# Patient Record
Sex: Male | Born: 1970 | Race: Black or African American | Hispanic: No | Marital: Married | Smoking: Never smoker
Health system: Southern US, Community
[De-identification: ages and names within clinical notes are randomized; demographics above are authoritative.]

## PROBLEM LIST (undated history)

## (undated) DIAGNOSIS — E119 Type 2 diabetes mellitus without complications: Secondary | ICD-10-CM

## (undated) DIAGNOSIS — I10 Essential (primary) hypertension: Secondary | ICD-10-CM

## (undated) DIAGNOSIS — N200 Calculus of kidney: Secondary | ICD-10-CM

## (undated) HISTORY — PX: KIDNEY STONE SURGERY: SHX686

---

## 2016-08-07 ENCOUNTER — Emergency Department (HOSPITAL_COMMUNITY)
Admission: EM | Admit: 2016-08-07 | Discharge: 2016-08-08 | Disposition: A | Discharge status: Home / Self Care | Payer: Self-pay | Attending: Emergency Medicine | Admitting: Emergency Medicine

## 2016-08-07 ENCOUNTER — Encounter (HOSPITAL_COMMUNITY): Payer: Self-pay | Admitting: Emergency Medicine

## 2016-08-07 DIAGNOSIS — Y999 Unspecified external cause status: Secondary | ICD-10-CM | POA: Insufficient documentation

## 2016-08-07 DIAGNOSIS — T17208A Unspecified foreign body in pharynx causing other injury, initial encounter: Secondary | ICD-10-CM | POA: Insufficient documentation

## 2016-08-07 DIAGNOSIS — Y939 Activity, unspecified: Secondary | ICD-10-CM | POA: Insufficient documentation

## 2016-08-07 DIAGNOSIS — Y929 Unspecified place or not applicable: Secondary | ICD-10-CM | POA: Insufficient documentation

## 2016-08-07 DIAGNOSIS — X58XXXA Exposure to other specified factors, initial encounter: Secondary | ICD-10-CM | POA: Insufficient documentation

## 2016-08-07 MED ORDER — BENZOCAINE (TOPICAL) 20 % EX AERO
INHALATION_SPRAY | Freq: Once | CUTANEOUS | Status: AC
  Administered 2016-08-08: via OROMUCOSAL
  Filled 2016-08-07: qty 57

## 2016-08-07 NOTE — ED Notes (Signed)
ED Provider at bedside with ENT cart.  

## 2016-08-07 NOTE — ED Triage Notes (Signed)
Pt. reports fish bone is stuck at right side of throat this evening , denies emesis /respirations unlabored .

## 2016-08-07 NOTE — ED Provider Notes (Signed)
[facility]-EMERGENCY DEPT Provider Note   CSN: [id number] Arrival date & time: 08/07/16  2256  By signing my name below, I, Christopher Kaiser, attest that this documentation has been prepared under the direction and in the presence of Dione Boozeavid Maelynn Moroney, MD. Electronically Signed: Rosario AdieWilliam Andrew Kaiser, ED Scribe. 08/07/16. 11:21 PM.  History   Chief Complaint Chief Complaint  Patient presents with  . Fish Bone In Throat   The history is provided by the patient. No language interpreter was used.    HPI Comments: Christopher Kaiser is a 45 y.o. male with no pertinent PMHx, who presents to the Emergency Department complaining of sensation of foreign body to the throat onset earlier this evening. Pt reports that he was eating a small piece of fish tonight when his sensation began. He states that he looked into his throat after his sensation began and noticed a small foreign body lodged into his right tonsil. Pt notes associated mild pain to the area of this sensation which is exacerbated with swallowing. Extraction events prior to coming into the ED were unsuccessful. He is a non-smoker and non-drinker. Pt denies nausea, emesis, difficulty swallowing, or any other associated symptoms.   PCP: Tracey Harriesavid Bouska, MD   History reviewed. No pertinent past medical history.  There are no active problems to display for this patient.  History reviewed. No pertinent surgical history.  Home Medications    Prior to Admission medications   Not on File   Family History No family history on file.  Social History Social History  Substance Use Topics  . Smoking status: Never Smoker  . Smokeless tobacco: Never Used  . Alcohol use No   Allergies   Patient has no known allergies.  Review of Systems Review of Systems  HENT: Positive for sore throat. Negative for trouble swallowing.        Positive for sensation of foreign body.   Gastrointestinal: Negative for nausea and vomiting.  All other systems reviewed and  are negative.  Physical Exam Updated Vital Signs BP 153/92 (BP Location: Left Arm)   Pulse 74   Temp 97.8 F (36.6 C) (Oral)   Resp 16   Ht 6' 1.5" (1.867 m)   Wt 289 lb (131.1 kg)   SpO2 100%   BMI 37.61 kg/m   Physical Exam  Constitutional: He is oriented to person, place, and time. He appears well-developed and well-nourished.  HENT:  Head: Normocephalic and atraumatic.  Foreign body present in the right tonsil.   Eyes: EOM are normal. Pupils are equal, round, and reactive to light.  Neck: Normal range of motion. Neck supple. No JVD present.  Cardiovascular: Normal rate, regular rhythm and normal heart sounds.   No murmur heard. Pulmonary/Chest: Effort normal and breath sounds normal. He has no wheezes. He has no rales. He exhibits no tenderness.  Abdominal: Soft. Bowel sounds are normal. He exhibits no distension and no mass. There is no tenderness.  Musculoskeletal: Normal range of motion. He exhibits no edema.  Lymphadenopathy:    He has no cervical adenopathy.  Neurological: He is alert and oriented to person, place, and time. No cranial nerve deficit. He exhibits normal muscle tone. Coordination normal.  Skin: Skin is warm and dry. No rash noted.  Psychiatric: He has a normal mood and affect. His behavior is normal. Judgment and thought content normal.  Nursing note and vitals reviewed.  ED Treatments / Results  DIAGNOSTIC STUDIES: Oxygen Saturation is 100% on RA, normal by my interpretation.  COORDINATION OF CARE: 11:21 PM-Discussed next steps with pt. Pt verbalized understanding and is agreeable with the plan.    Procedures .Foreign Body Removal Date/Time: 08/08/2016 12:16 AM Performed by: Dione BoozeGLICK, Kester Stimpson Authorized by: Preston FleetingGLICK, Raekwon Winkowski  Consent: Verbal consent obtained. Written consent not obtained. Risks and benefits: risks, benefits and alternatives were discussed Consent given by: patient Patient understanding: patient states understanding of the procedure  being performed Patient consent: the patient's understanding of the procedure matches consent given Procedure consent: procedure consent matches procedure scheduled Relevant documents: relevant documents present and verified Required items: required blood products, implants, devices, and special equipment available Patient identity confirmed: verbally with patient and arm band Time out: Immediately prior to procedure a "time out" was called to verify the correct patient, procedure, equipment, support staff and site/side marked as required. Body area: throat  Anesthesia: Local Anesthetic: topical anesthetic  Sedation: Patient sedated: no Patient restrained: no Patient cooperative: yes Localization method: visualized Removal mechanism: forceps Complexity: simple 1 objects recovered. Objects recovered: fish bone Post-procedure assessment: foreign body removed Patient tolerance: Patient tolerated the procedure well with no immediate complications     Medications Ordered in ED Medications - No data to display  Initial Impression / Assessment and Plan / ED Course  I have reviewed the triage vital signs and the nursing notes.   Clinical Course    Foreign body on the right tonsil visualized. Remove with alligator forceps under direct visualization.  Final Clinical Impressions(s) / ED Diagnoses   Final diagnoses:  Foreign body of tonsil, initial encounter   New Prescriptions New Prescriptions   No medications on file   I personally performed the services described in this documentation, which was scribed in my presence. The recorded information has been reviewed and is accurate.      Dione Boozeavid Carollynn Pennywell, MD 08/08/16 [phone]

## 2016-08-08 NOTE — ED Notes (Signed)
Pt departed in NAD.  

## 2016-08-08 NOTE — Discharge Instructions (Signed)
Drink plenty of fluids. Use lozenges as needed.

## 2020-06-19 ENCOUNTER — Ambulatory Visit: Payer: BLUE CROSS/BLUE SHIELD | Admitting: Podiatry

## 2021-09-10 ENCOUNTER — Other Ambulatory Visit (HOSPITAL_BASED_OUTPATIENT_CLINIC_OR_DEPARTMENT_OTHER): Payer: Self-pay

## 2021-09-10 MED ORDER — OZEMPIC (0.25 OR 0.5 MG/DOSE) 2 MG/1.5ML ~~LOC~~ SOPN
PEN_INJECTOR | SUBCUTANEOUS | 0 refills | Status: AC
  Filled 2021-09-10 – 2021-09-11 (×2): qty 1.5, 28d supply, fill #0
  Filled 2021-09-14: qty 1.5, 30d supply, fill #0
  Filled 2021-09-15: qty 1.5, 28d supply, fill #0

## 2021-09-11 ENCOUNTER — Other Ambulatory Visit (HOSPITAL_BASED_OUTPATIENT_CLINIC_OR_DEPARTMENT_OTHER): Payer: Self-pay

## 2021-09-14 ENCOUNTER — Other Ambulatory Visit (HOSPITAL_BASED_OUTPATIENT_CLINIC_OR_DEPARTMENT_OTHER): Payer: Self-pay

## 2021-09-15 ENCOUNTER — Other Ambulatory Visit (HOSPITAL_BASED_OUTPATIENT_CLINIC_OR_DEPARTMENT_OTHER): Payer: Self-pay

## 2021-10-23 ENCOUNTER — Emergency Department (HOSPITAL_BASED_OUTPATIENT_CLINIC_OR_DEPARTMENT_OTHER): Payer: Commercial Managed Care - HMO

## 2021-10-23 ENCOUNTER — Encounter (HOSPITAL_BASED_OUTPATIENT_CLINIC_OR_DEPARTMENT_OTHER): Payer: Self-pay

## 2021-10-23 ENCOUNTER — Other Ambulatory Visit: Payer: Self-pay

## 2021-10-23 ENCOUNTER — Emergency Department (HOSPITAL_BASED_OUTPATIENT_CLINIC_OR_DEPARTMENT_OTHER)
Admission: EM | Admit: 2021-10-23 | Discharge: 2021-10-23 | Disposition: A | Discharge status: Home / Self Care | Payer: Commercial Managed Care - HMO | Attending: Emergency Medicine | Admitting: Emergency Medicine

## 2021-10-23 DIAGNOSIS — M5440 Lumbago with sciatica, unspecified side: Secondary | ICD-10-CM | POA: Insufficient documentation

## 2021-10-23 DIAGNOSIS — Y92410 Unspecified street and highway as the place of occurrence of the external cause: Secondary | ICD-10-CM | POA: Insufficient documentation

## 2021-10-23 DIAGNOSIS — M545 Low back pain, unspecified: Secondary | ICD-10-CM

## 2021-10-23 HISTORY — DX: Essential (primary) hypertension: I10

## 2021-10-23 HISTORY — DX: Calculus of kidney: N20.0

## 2021-10-23 HISTORY — DX: Type 2 diabetes mellitus without complications: E11.9

## 2021-10-23 MED ORDER — IBUPROFEN 200 MG PO TABS
600.0000 mg | ORAL_TABLET | Freq: Once | ORAL | Status: AC
  Administered 2021-10-23: 600 mg via ORAL
  Filled 2021-10-23: qty 1

## 2021-10-23 MED ORDER — LIDOCAINE 5 % EX PTCH: 1.0000 | MEDICATED_PATCH | CUTANEOUS | 0 refills | Status: AC

## 2021-10-23 MED ORDER — METHOCARBAMOL 500 MG PO TABS: 500.0000 mg | ORAL_TABLET | Freq: Two times a day (BID) | ORAL | 0 refills | Status: AC

## 2021-10-23 MED ORDER — METHOCARBAMOL 500 MG PO TABS
500.0000 mg | ORAL_TABLET | Freq: Once | ORAL | Status: AC
  Administered 2021-10-23: 500 mg via ORAL
  Filled 2021-10-23: qty 1

## 2021-10-23 MED ORDER — LIDOCAINE 5 % EX PTCH
1.0000 | MEDICATED_PATCH | Freq: Once | CUTANEOUS | Status: DC
  Administered 2021-10-23: 1 via TRANSDERMAL
  Filled 2021-10-23: qty 1
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
## 2021-10-23 NOTE — ED Notes (Signed)
Patient transported to X-ray 

## 2021-10-23 NOTE — Discharge Instructions (Addendum)
It was a pleasure taking care of you today!  Your imaging in the ED was negative for fracture or dislocations. You will feel more sore in the morning. You are prescribed Robaxin (muscle relaxer). Do not drive or operate heavy machinery while taking the muscle relaxer.  You also be sent a prescription for Lidoderm patch.  You may place 1 patch to your lower back and change it after 12 hours to a new patch.  You may take over the counter 600 mg Ibuprofen every 6 hours or 500 mg Tylenol every 6 hours as needed for pain for no more than 7 days. You may apply ice or heat to affected area for up to 15 minutes at a time. Ensure to place a barrier between your skin and the ice/heat. Return to the Emergency Department if you are experiencing increasing/worsening back pain, fever, color change, or worsening symptoms.
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
## 2021-10-27 ENCOUNTER — Other Ambulatory Visit: Payer: Self-pay

## 2021-10-27 ENCOUNTER — Encounter (HOSPITAL_COMMUNITY): Payer: Self-pay

## 2021-10-27 ENCOUNTER — Ambulatory Visit (HOSPITAL_COMMUNITY)
Admission: EM | Admit: 2021-10-27 | Discharge: 2021-10-27 | Disposition: A | Discharge status: Home / Self Care | Payer: Managed Care, Other (non HMO) | Attending: Urgent Care | Admitting: Urgent Care

## 2021-10-27 DIAGNOSIS — I10 Essential (primary) hypertension: Secondary | ICD-10-CM | POA: Diagnosis not present

## 2021-10-27 DIAGNOSIS — M545 Low back pain, unspecified: Secondary | ICD-10-CM | POA: Diagnosis not present

## 2021-10-27 MED ORDER — NAPROXEN 375 MG PO TABS: 375.0000 mg | ORAL_TABLET | Freq: Two times a day (BID) | ORAL | 0 refills | Status: AC

## 2021-10-27 MED ORDER — TIZANIDINE HCL 4 MG PO TABS: 4.0000 mg | ORAL_TABLET | Freq: Every day | ORAL | 0 refills | Status: AC
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
## 2021-10-28 ENCOUNTER — Encounter: Payer: Self-pay | Admitting: Physical Therapy

## 2021-10-28 ENCOUNTER — Ambulatory Visit: Payer: Commercial Managed Care - HMO | Attending: Family Medicine | Admitting: Physical Therapy

## 2021-10-28 DIAGNOSIS — M545 Low back pain, unspecified: Secondary | ICD-10-CM | POA: Diagnosis present

## 2021-10-28 DIAGNOSIS — M6281 Muscle weakness (generalized): Secondary | ICD-10-CM | POA: Insufficient documentation

## 2021-10-28 DIAGNOSIS — R252 Cramp and spasm: Secondary | ICD-10-CM | POA: Insufficient documentation
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
## 2021-10-28 NOTE — Patient Instructions (Signed)
Access Code: [email or web address] ?URL: [email or web address] ?Date: 10/28/2021 ?Prepared by: Almyra Free ? ?Exercises ?Supine Double Knee to Chest - 3 x daily - 7 x weekly - 1 sets - 3 reps - 15 sec hold ?Hooklying Single Knee to Chest Stretch - 3 x daily - 7 x weekly - 1 sets - 3 reps - 20-30 sec hold ?Supine Lower Trunk Rotation - 2 x daily - 7 x weekly - 1 sets - 5 reps - 10 sec hold ?Seated Piriformis Stretch with Trunk Bend - 2 x daily - 7 x weekly - 1 sets - 3 reps - 30-60 sec hold ?Seated Table Hamstring Stretch - 2 x daily - 7 x weekly - 1 sets - 3 reps - 30-60 sec hold ? ?

## 2021-10-29 ENCOUNTER — Encounter: Payer: Self-pay | Admitting: Physical Therapy

## 2021-10-30 ENCOUNTER — Other Ambulatory Visit: Payer: Self-pay

## 2021-10-30 ENCOUNTER — Encounter: Payer: Self-pay | Admitting: Rehabilitative and Restorative Service Providers"

## 2021-10-30 ENCOUNTER — Ambulatory Visit: Payer: Commercial Managed Care - HMO | Admitting: Rehabilitative and Restorative Service Providers"

## 2021-10-30 DIAGNOSIS — R252 Cramp and spasm: Secondary | ICD-10-CM

## 2021-10-30 DIAGNOSIS — M545 Low back pain, unspecified: Secondary | ICD-10-CM

## 2021-10-30 DIAGNOSIS — M6281 Muscle weakness (generalized): Secondary | ICD-10-CM

## 2021-10-30 NOTE — Patient Instructions (Signed)
TENS UNIT: ?This is helpful for muscle pain and spasm.  ? ?Search and Purchase a TENS 7000 2nd edition at www.tenspros.com. ?It should be less than $30.  ? ? ? ?TENS unit instructions: Do not shower or bathe with the unit on ?Turn the unit off before removing electrodes or batteries ?If the electrodes lose stickiness add a drop of water to the electrodes after they are disconnected from the unit and place on plastic sheet. If you continued to have difficulty, call the TENS unit company to purchase more electrodes. ?Do not apply lotion on the skin area prior to use. Make sure the skin is clean and dry as this will help prolong the life of the electrodes. ?After use, always check skin for unusual red areas, rash or other skin difficulties. If there are any skin problems, does not apply electrodes to the same area. ?Never remove the electrodes from the unit by pulling the wires. ?Do not use the TENS unit or electrodes other than as directed. ?Do not change electrode placement without consultating your therapist or physician. ?Keep 2 fingers with between each electrode. ? ?Trigger Point Dry Needling ? ?What is Trigger Point Dry Needling (DN)? ?DN is a physical therapy technique used to treat muscle pain and dysfunction. Specifically, DN helps deactivate muscle trigger points (muscle knots).  ?A thin filiform needle is used to penetrate the skin and stimulate the underlying trigger point. The goal is for a local twitch response (LTR) to occur and for the trigger point to relax. No medication of any kind is injected during the procedure.  ? ?What Does Trigger Point Dry Needling Feel Like?  ?The procedure feels different for each individual patient. Some patients report that they do not actually feel the needle enter the skin and overall the process is not painful. Very mild bleeding may occur. However, many patients feel a deep cramping in the muscle in which the needle was inserted. This is the local twitch response.   ? ?How Will I feel after the treatment? ?Soreness is normal, and the onset of soreness may not occur for a few hours. Typically this soreness does not last longer than two days.  ?Bruising is uncommon, however; ice can be used to decrease any possible bruising.  ?In rare cases feeling tired or nauseous after the treatment is normal. In addition, your symptoms may get worse before they get better, this period will typically not last longer than 24 hours.  ? ?What Can I do After My Treatment? ?Increase your hydration by drinking more water for the next 24 hours. ?You may place ice or heat on the areas treated that have become sore, however, do not use heat on inflamed or bruised areas. Heat often brings more relief post needling. ?You can continue your regular activities, but vigorous activity is not recommended initially after the treatment for 24 hours. ?DN is best combined with other physical therapy such as strengthening, stretching, and other therapies.  ? ? ? ?

## 2021-10-30 NOTE — Therapy (Signed)
[place] ?[facility] ?[address] [place] [address] ?[place], [place], [address] ?Phone: [phone]   Fax:  [phone] ? ?Physical Therapy Treatment ? ?Patient Details  ?Name: Christopher Kaiser ?MRN: [id number] ?Date of Birth: [birth year] ?Referring Provider (PT): Tammy Eartha Inch ? ? ?Encounter Date: 10/30/2021 ? ? PT End of Session - 10/30/21 1313   ? ? Visit Number 2   ? Number of Visits 16   ? Date for PT Re-Evaluation 12/23/21   ? Authorization Type Med Pay /[employer] Managed   ? PT Start Time 1315   ? PT Stop Time 1403   ? PT Time Calculation (min) 48 min   ? Activity Tolerance Patient tolerated treatment well   ? ?  ?  ? ?  ? ? ?Past Medical History:  ?Diagnosis Date  ? Diabetes mellitus without complication (HCC)   ? Hypertension   ? Kidney stone   ? ? ?Past Surgical History:  ?Procedure Laterality Date  ? KIDNEY STONE SURGERY    ? ? ?There were no vitals filed for this visit. ? ? Subjective Assessment - 10/30/21 1315   ? ? Subjective Patient reports flare up of pain about 8 PM the day he was here. He has taken meds today which helps. He has had DN in the past for the neck and shoulder area about two years ago.   ? Currently in Pain? Yes   ? Pain Score 3    ? Pain Location Back   ? Pain Orientation Lower;Mid;Left   ? Pain Descriptors / Indicators Dull;Aching;Nagging   ? Pain Type Acute pain   ? ?  ?  ? ?  ? ? ? ? ? ? ? ? ? ? ? ? ? ? ? ? ? ? ? ? [facility] Adult PT Treatment/Exercise - 10/30/21 0001   ? ?  ? Self-Care  ? Self-Care Other Self-Care Comments   ? Other Self-Care Comments  education re posture and alignment when resting at home   ?  ? Moist Heat Therapy  ? Number Minutes Moist Heat 10 Minutes   ? Moist Heat Location Lumbar Spine;Hip   ?  ? Electrical Stimulation  ? Electrical Stimulation Location bilat lumbar and posterior hips   ? Electrical Stimulation Action TENS   ? Electrical Stimulation Parameters to tolerance   ? Electrical Stimulation Goals Pain;Tone   ?  ? Manual Therapy   ? Manual therapy comments skilled palpation to assess response to DN and manual work   ? Soft tissue mobilization deep tissue work through bilat lumbar and posterior hips   ? Myofascial Release lumbar spine   ? ?  ?  ? ?  ? ? ? Trigger Point Dry Needling - 10/30/21 0001   ? ? Consent Given? Yes   ? Education Handout Provided Yes   ? Gluteus Medius Response Palpable increased muscle length;Twitch response elicited   ? Gluteus Maximus Response Palpable increased muscle length;Twitch response elicited   ? Piriformis Response Palpable increased muscle length;Twitch response elicited   ? Erector spinae Response Palpable increased muscle length;Twitch response elicited   ? Lumbar multifidi Response Palpable increased muscle length;Twitch response elicited   ? Quadratus Lumborum Response Palpable increased muscle length;Twitch response elicited   ? ?  ?  ? ?  ? ? ? ? ? ? ? ? PT Education - 10/30/21 1343   ? ? Education Details DN; TENS   ? Person(s) Educated Patient   ? Methods Explanation;Demonstration;Handout   ? Comprehension  Verbalized understanding   ? ?  ?  ? ?  ? ? ? PT Short Term Goals - 10/28/21 1508   ? ?  ? PT SHORT TERM GOAL #1  ? Title Ind with initial HEP   ? Time 2   ? Period Weeks   ? Status New   ? Target Date 11/11/21   ? ?  ?  ? ?  ? ? ? ? PT Long Term Goals - 10/28/21 1508   ? ?  ? PT LONG TERM GOAL #1  ? Title Ind with advanced HEP   ? Time 8   ? Period Weeks   ? Status New   ? Target Date 12/23/21   ?  ? PT LONG TERM GOAL #2  ? Title Patient able to climb stairs with a reciprocal gait and >= 90% reduction in pain.   ? Time 8   ? Period Weeks   ? Status New   ?  ? PT LONG TERM GOAL #3  ? Title Patient able to sit for work and driving for > one hour with no increase in LBP   ? Time 8   ? Period Weeks   ? Status New   ?  ? PT LONG TERM GOAL #4  ? Title Patient able to perform ADLs with functional lumbar ROM.   ? Time 8   ? Period Weeks   ? Status New   ?  ? PT LONG TERM GOAL #5  ? Title Improved hip  strength to 5/5 to be able to peform correct body mechanics with squatting and lifting.   ? Time 8   ? Period Weeks   ? Status New   ?  ? Additional Long Term Goals  ? Additional Long Term Goals Yes   ?  ? PT LONG TERM GOAL #6  ? Title improved FOTO to 55 from 16 showing improved functional ability.   ? Time 8   ? Period Weeks   ? Status New   ? ?  ?  ? ?  ? ? ? ? ? ? ? ? Plan - 10/30/21 1340   ? ? Clinical Impression Statement Patient reports flare up of pain following initial evaluation and treatment. Symptoms are better today with medication. Patient tolerated trial of DN with some c/o pain with needling but he had positive response to DN and manual work followed by trial of TENS and moist heat. Encouraged patient to gradually increased activity, avoid forward posture and prolonged postures and work on exercises as tolerated.   ? Rehab Potential Good   ? PT Frequency 2x / week   ? PT Duration 8 weeks   ? PT Treatment/Interventions ADLs/Self Care Home Management;Aquatic Therapy;Cryotherapy;Electrical Stimulation;Moist Heat;Traction;Neuromuscular re-education;Therapeutic exercise;Therapeutic activities;Functional mobility training;Stair training;Patient/family education;Manual techniques;Dry needling;Taping   ? PT Next Visit Plan review HEP, work on core strength; assess response to manual/DN to low back and glute min   ? PT Home Exercise Plan [email or web address]   ? Consulted and Agree with Plan of Care Patient   ? ?  ?  ? ?  ? ? ?Patient will benefit from skilled therapeutic intervention in order to improve the following deficits and impairments:    ? ?Visit Diagnosis: ?Acute bilateral low back pain without sciatica ? ?Cramp and spasm ? ?Muscle weakness (generalized) ? ? ? ? ?Problem List ?There are no problems to display for this patient. ? ? ?Everardo All, PT, MPH ?10/30/2021, 1:50 PM ? ?[place] ?  [facility] ?[place] ?[place], [place], [address] ?Phone: [phone]    Fax:  [phone] ? ?Name: Christopher Kaiser ?MRN: [id number] ?Date of Birth: [birth year] ? ? ? ?

## 2021-11-03 ENCOUNTER — Ambulatory Visit: Payer: Commercial Managed Care - HMO | Admitting: Rehabilitative and Restorative Service Providers"

## 2021-11-04 ENCOUNTER — Other Ambulatory Visit: Payer: Self-pay

## 2021-11-04 ENCOUNTER — Ambulatory Visit: Payer: Commercial Managed Care - HMO | Admitting: Physical Therapy

## 2021-11-04 DIAGNOSIS — M545 Low back pain, unspecified: Secondary | ICD-10-CM

## 2021-11-04 DIAGNOSIS — R252 Cramp and spasm: Secondary | ICD-10-CM

## 2021-11-04 DIAGNOSIS — M6281 Muscle weakness (generalized): Secondary | ICD-10-CM

## 2021-11-04 NOTE — Therapy (Signed)
[place] ?[facility] ?[address] ?[place], [place], [address] ?Phone: [phone]   Fax:  [phone] ? ?Physical Therapy Treatment ? ?Patient Details  ?Name: Christopher Kaiser ?MRN: [id number] ?Date of Birth: Jan 23, 1971 ?Referring Provider (PT): Tammy Arn Medal ? ? ?Encounter Date: 11/04/2021 ? ? PT End of Session - 11/04/21 1553   ? ? Visit Number 3   ? Number of Visits 16   ? Date for PT Re-Evaluation 12/23/21   ? Authorization Type Med Pay /[employer] Managed   ? PT Start Time 1515   ? PT Stop Time [id number]   ? PT Time Calculation (min) 48 min   ? Activity Tolerance Patient tolerated treatment well   ? Behavior During Therapy [facility] for tasks assessed/performed   ? ?  ?  ? ?  ? ? ?Past Medical History:  ?Diagnosis Date  ? Diabetes mellitus without complication ([place])   ? Hypertension   ? Kidney stone   ? ? ?Past Surgical History:  ?Procedure Laterality Date  ? KIDNEY STONE SURGERY    ? ? ?There were no vitals filed for this visit. ? ? Subjective Assessment - 11/04/21 1522   ? ? Subjective Pt states the needling really helped for a day or 2, he feels "tight" again now. He states he feels he just needs to stretch   ? Patient Stated Goals strengthen my core and back, return to PLOF   ? Currently in Pain? Yes   ? Pain Score 3    ? Pain Location Back   ? Pain Orientation Lower;Mid   ? ?  ?  ? ?  ? ? ? ? ? ? ? ? ? ? ? ? ? ? ? ? ? ? ? ? [facility] Adult PT Treatment/Exercise - 11/04/21 0001   ? ?  ? Exercises  ? Exercises Lumbar   ?  ? Lumbar Exercises: Stretches  ? Passive Hamstring Stretch 2 reps;Left;Right;30 seconds   ? Single Knee to Chest Stretch Right;Left;2 reps;30 seconds   ? Piriformis Stretch 2 reps;30 seconds   ? Piriformis Stretch Limitations knee to opp shoulder   ? Other Lumbar Stretch Exercise lower trunk rotation x 10 bilat   ?  ? Lumbar Exercises: Aerobic  ? Nustep L5 x 5 min to warm up   ?  ? Moist Heat Therapy  ? Number Minutes Moist Heat 10 Minutes   ? Moist Heat  Location Lumbar Spine   ?  ? Electrical Stimulation  ? Electrical Stimulation Location lower thoracic/lumbar   ? Electrical Stimulation Action TENS   ? Electrical Stimulation Parameters to tolerance   ? Electrical Stimulation Goals Pain   ?  ? Manual Therapy  ? Soft tissue mobilization STM bilat lumbar and lower thoracic paraspinals   ? ?  ?  ? ?  ? ? ? ? ? ? ? ? ? ? ? ? PT Short Term Goals - 10/28/21 1508   ? ?  ? PT SHORT TERM GOAL #1  ? Title Ind with initial HEP   ? Time 2   ? Period Weeks   ? Status New   ? Target Date 11/11/21   ? ?  ?  ? ?  ? ? ? ? PT Long Term Goals - 10/28/21 1508   ? ?  ? PT LONG TERM GOAL #1  ? Title Ind with advanced HEP   ? Time 8   ? Period Weeks   ? Status New   ?  Target Date 12/23/21   ?  ? PT LONG TERM GOAL #2  ? Title Patient able to climb stairs with a reciprocal gait and >= 90% reduction in pain.   ? Time 8   ? Period Weeks   ? Status New   ?  ? PT LONG TERM GOAL #3  ? Title Patient able to sit for work and driving for > one hour with no increase in LBP   ? Time 8   ? Period Weeks   ? Status New   ?  ? PT LONG TERM GOAL #4  ? Title Patient able to perform ADLs with functional lumbar ROM.   ? Time 8   ? Period Weeks   ? Status New   ?  ? PT LONG TERM GOAL #5  ? Title Improved hip strength to 5/5 to be able to peform correct body mechanics with squatting and lifting.   ? Time 8   ? Period Weeks   ? Status New   ?  ? Additional Long Term Goals  ? Additional Long Term Goals Yes   ?  ? PT LONG TERM GOAL #6  ? Title improved FOTO to 55 from 16 showing improved functional ability.   ? Time 8   ? Period Weeks   ? Status New   ? ?  ?  ? ?  ? ? ? ? ? ? ? ? Plan - 11/04/21 1553   ? ? Clinical Impression Statement Pt reports he felt better after needling but still feels "Stiff". Session focused on stretching with pt with good tolerance and results. Good response to manual work, pt defers dry needling this visit   ? PT Next Visit Plan review and progress HEP. core strength. manual/modalities  as needed   ? PT Home Exercise Plan [email or web address]   ? Consulted and Agree with Plan of Care Patient   ? ?  ?  ? ?  ? ? ?Patient will benefit from skilled therapeutic intervention in order to improve the following deficits and impairments:    ? ?Visit Diagnosis: ?Acute bilateral low back pain without sciatica ? ?Cramp and spasm ? ?Muscle weakness (generalized) ? ? ? ? ?Problem List ?There are no problems to display for this patient. ? ? ?Raquon Milledge, PT ?11/04/2021, 3:54 PM ? ? ?[facility] ?[place] ?[place], [place], [address] ?Phone: [phone]   Fax:  [phone] ? ?Name: Christopher Kaiser ?MRN: [id number] ?Date of Birth: September 06, 1970 ? ? ? ?

## 2021-11-06 ENCOUNTER — Other Ambulatory Visit: Payer: Self-pay

## 2021-11-06 ENCOUNTER — Ambulatory Visit: Payer: Commercial Managed Care - HMO | Admitting: Physical Therapy

## 2021-11-06 DIAGNOSIS — M545 Low back pain, unspecified: Secondary | ICD-10-CM

## 2021-11-06 DIAGNOSIS — R252 Cramp and spasm: Secondary | ICD-10-CM

## 2021-11-06 DIAGNOSIS — M6281 Muscle weakness (generalized): Secondary | ICD-10-CM

## 2021-11-06 NOTE — Therapy (Signed)
[place] ?[facility] ?[address] ?[place], [place], [address] ?Phone: [phone]   Fax:  [phone] ? ?Physical Therapy Treatment ? ?Patient Details  ?Name: Christopher Kaiser ?MRN: [id number] ?Date of Birth: 04/22/1971 ?Referring Provider (PT): Tammy Arn Medal ? ? ?Encounter Date: 11/06/2021 ? ? PT End of Session - 11/06/21 1132   ? ? Visit Number 4   ? Number of Visits 16   ? Date for PT Re-Evaluation 12/23/21   ? Authorization Type Med Pay /[employer] Managed   ? PT Start Time 1055   ? PT Stop Time 1140   ? PT Time Calculation (min) 45 min   ? Activity Tolerance Patient tolerated treatment well   ? Behavior During Therapy [facility] for tasks assessed/performed   ? ?  ?  ? ?  ? ? ?Past Medical History:  ?Diagnosis Date  ? Diabetes mellitus without complication ([place])   ? Hypertension   ? Kidney stone   ? ? ?Past Surgical History:  ?Procedure Laterality Date  ? KIDNEY STONE SURGERY    ? ? ?There were no vitals filed for this visit. ? ? Subjective Assessment - 11/06/21 1057   ? ? Subjective Pt states he felt good after last visit but is haivng pain today   ? Patient Stated Goals strengthen my core and back, return to PLOF   ? Currently in Pain? Yes   ? Pain Score 3    ? Pain Location Back   ? Pain Orientation Lower   ? ?  ?  ? ?  ? ? ? ? ? ? ? ? ? ? ? ? ? ? ? ? ? ? ? ? [place] Adult PT Treatment/Exercise - 11/06/21 0001   ? ?  ? Lumbar Exercises: Stretches  ? Passive Hamstring Stretch 2 reps;Left;Right;30 seconds   ? Single Knee to Chest Stretch Right;Left;2 reps;30 seconds   ? Piriformis Stretch 2 reps;30 seconds   ? Piriformis Stretch Limitations knee to opp shoulder   ? Other Lumbar Stretch Exercise lower trunk rotation x 10 bilat   ? Other Lumbar Stretch Exercise L stretch at counter forward and each side 2 x 30 sec   ?  ? Lumbar Exercises: Aerobic  ? Nustep L5 x 5 min to warm up   ?  ? Lumbar Exercises: Supine  ? Ab Set 5 reps;5 seconds   ? Bridge 10 reps   ? Bridge Limitations  3 sec hold at end range   ?  ? Moist Heat Therapy  ? Number Minutes Moist Heat 10 Minutes   ? Moist Heat Location Lumbar Spine   ?  ? Electrical Stimulation  ? Electrical Stimulation Location lumbar   ? Electrical Stimulation Action TENS   ? Electrical Stimulation Parameters to tolerance   ? Electrical Stimulation Goals Pain   ?  ? Manual Therapy  ? Soft tissue mobilization STM bilat lumbar and lower thoracic paraspinals   ? ?  ?  ? ?  ? ? ? ? ? ? ? ? ? ? PT Education - 11/06/21 1132   ? ? Education Details updated HEP   ? Person(s) Educated Patient   ? Methods Explanation;Demonstration;Handout   ? Comprehension Returned demonstration;Verbalized understanding   ? ?  ?  ? ?  ? ? ? PT Short Term Goals - 10/28/21 1508   ? ?  ? PT SHORT TERM GOAL #1  ? Title Ind with initial HEP   ? Time 2   ?  Period Weeks   ? Status New   ? Target Date 11/11/21   ? ?  ?  ? ?  ? ? ? ? PT Long Term Goals - 10/28/21 1508   ? ?  ? PT LONG TERM GOAL #1  ? Title Ind with advanced HEP   ? Time 8   ? Period Weeks   ? Status New   ? Target Date 12/23/21   ?  ? PT LONG TERM GOAL #2  ? Title Patient able to climb stairs with a reciprocal gait and >= 90% reduction in pain.   ? Time 8   ? Period Weeks   ? Status New   ?  ? PT LONG TERM GOAL #3  ? Title Patient able to sit for work and driving for > one hour with no increase in LBP   ? Time 8   ? Period Weeks   ? Status New   ?  ? PT LONG TERM GOAL #4  ? Title Patient able to perform ADLs with functional lumbar ROM.   ? Time 8   ? Period Weeks   ? Status New   ?  ? PT LONG TERM GOAL #5  ? Title Improved hip strength to 5/5 to be able to peform correct body mechanics with squatting and lifting.   ? Time 8   ? Period Weeks   ? Status New   ?  ? Additional Long Term Goals  ? Additional Long Term Goals Yes   ?  ? PT LONG TERM GOAL #6  ? Title improved FOTO to 55 from 16 showing improved functional ability.   ? Time 8   ? Period Weeks   ? Status New   ? ?  ?  ? ?  ? ? ? ? ? ? ? ? Plan - 11/06/21 1132    ? ? Clinical Impression Statement Pt with noted core weakness. Initiated ab sets and bridges, HEP updated. Pt reports he feels much better after PT session.   ? PT Next Visit Plan progress core strength. manual/modalities as needed   ? PT Home Exercise Plan [email or web address]   ? Consulted and Agree with Plan of Care Patient   ? ?  ?  ? ?  ? ? ?Patient will benefit from skilled therapeutic intervention in order to improve the following deficits and impairments:    ? ?Visit Diagnosis: ?Acute bilateral low back pain without sciatica ? ?Cramp and spasm ? ?Muscle weakness (generalized) ? ? ? ? ?Problem List ?There are no problems to display for this patient. ? ? ?Zyion Leidner, PT ?11/06/2021, 11:34 AM ? ?[place] ?[facility] ?[address] ?[place], [place], [address] ?Phone: [phone]   Fax:  [phone] ? ?Name: Mccormick Rachlin ?MRN: [id number] ?Date of Birth: [birth year] ? ? ? ?

## 2021-11-06 NOTE — Patient Instructions (Signed)
Access Code: [email or web address] ?URL: [email or web address] ?Date: 11/06/2021 ?Prepared by: Reggy Eye ? ?Exercises ?Supine Double Knee to Chest - 3 x daily - 7 x weekly - 1 sets - 3 reps - 15 sec hold ?Hooklying Single Knee to Chest Stretch - 3 x daily - 7 x weekly - 1 sets - 3 reps - 20-30 sec hold ?Supine Lower Trunk Rotation - 2 x daily - 7 x weekly - 1 sets - 5 reps - 10 sec hold ?Seated Piriformis Stretch with Trunk Bend - 2 x daily - 7 x weekly - 1 sets - 3 reps - 30-60 sec hold ?Seated Table Hamstring Stretch - 2 x daily - 7 x weekly - 1 sets - 3 reps - 30-60 sec hold ?Standing 'L' Stretch at Counter - 1 x daily - 7 x weekly - 1 sets - 3 reps - 30-60 seconds hold ?Abdominal Bracing - 1 x daily - 7 x weekly - 1 sets - 10 reps - 5 seconds hold ? ?

## 2021-11-10 ENCOUNTER — Other Ambulatory Visit: Payer: Self-pay

## 2021-11-10 ENCOUNTER — Ambulatory Visit: Payer: Commercial Managed Care - HMO | Admitting: Physical Therapy

## 2021-11-10 DIAGNOSIS — R252 Cramp and spasm: Secondary | ICD-10-CM

## 2021-11-10 DIAGNOSIS — M6281 Muscle weakness (generalized): Secondary | ICD-10-CM

## 2021-11-10 DIAGNOSIS — M545 Low back pain, unspecified: Secondary | ICD-10-CM | POA: Diagnosis not present

## 2021-11-10 NOTE — Therapy (Signed)
[place] ?[facility] ?[address] ?[place], [place], [address] ?Phone: [phone]   Fax:  [phone] ? ?Physical Therapy Treatment ? ?Patient Details  ?Name: Christopher Kaiser ?MRN: [id number] ?Date of Birth: [birth year] ?Referring Provider (PT): Tammy Arn Medal ? ? ?Encounter Date: 11/10/2021 ? ? PT End of Session - 11/10/21 0943   ? ? Visit Number 5   ? Number of Visits 16   ? Date for PT Re-Evaluation 12/23/21   ? Authorization Type Med Pay /[employer] Managed   ? PT Start Time 0940   late arrival  ? PT Stop Time 1020   ? PT Time Calculation (min) 40 min   ? Activity Tolerance Patient tolerated treatment well   ? Behavior During Therapy [facility] for tasks assessed/performed   ? ?  ?  ? ?  ? ? ?Past Medical History:  ?Diagnosis Date  ? Diabetes mellitus without complication ([place])   ? Hypertension   ? Kidney stone   ? ? ?Past Surgical History:  ?Procedure Laterality Date  ? KIDNEY STONE SURGERY    ? ? ?There were no vitals filed for this visit. ? ? Subjective Assessment - 11/10/21 0943   ? ? Subjective Pt reports feeling tight in hamstrings and back this morning.   ? Pertinent History DM, HTN,   ? How long can you sit comfortably? 15 min   ? Diagnostic tests mild degenerative changes   ? Patient Stated Goals strengthen my core and back, return to PLOF   ? Currently in Pain? Yes   ? Pain Score 3    ? Pain Location Back   ? Pain Orientation Lower   ? Pain Type Acute pain   ? ?  ?  ? ?  ? ? ? ? ? [facility] PT Assessment - 11/10/21 0001   ? ?  ? Assessment  ? Medical Diagnosis low back pain   ? Referring Provider (PT) Tammy Arn Medal   ? Onset Date/Surgical Date 10/23/21   ? Hand Dominance Left   ? Next MD Visit couple of weeks   ? ?  ?  ? ?  ? ? ? ? ? ? ? ? ? ? ? ? ? ? ? ? [place] Adult PT Treatment/Exercise - 11/10/21 0001   ? ?  ? Lumbar Exercises: Stretches  ? Passive Hamstring Stretch 2 reps;Left;Right;30 seconds   ? Passive Hamstring Stretch Limitations seated   ? Lower Trunk  Rotation 2 reps;20 seconds   ? Piriformis Stretch 30 seconds   ? Piriformis Stretch Limitations seated   ? Figure 4 Stretch 30 seconds   ? Figure 4 Stretch Limitations seated   ?  ? Lumbar Exercises: Aerobic  ? Nustep L5 x 4 min warm up   ?  ? Lumbar Exercises: Standing  ? Shoulder Extension Strengthening;Theraband;20 reps   ? Theraband Level (Shoulder Extension) Level 3 (Green)   ? Shoulder Extension Limitations 3 sec hold with ab set   ? Other Standing Lumbar Exercises Palloff press 2x10 green tband   ?  ? Lumbar Exercises: Supine  ? Ab Set 5 reps;5 seconds   ? Bridge 10 reps   ? Bridge Limitations 3 sec hold at end range   ?  ? Moist Heat Therapy  ? Number Minutes Moist Heat 10 Minutes   ? Moist Heat Location Lumbar Spine   ?  ? Manual Therapy  ? Soft tissue mobilization STM bilat lumbar, glutes and lower thoracic paraspinals   ? ?  ?  ? ?  ? ? ? ? ? ? ? ? ? ? ? ?  PT Short Term Goals - 10/28/21 1508   ? ?  ? PT SHORT TERM GOAL #1  ? Title Ind with initial HEP   ? Time 2   ? Period Weeks   ? Status New   ? Target Date 11/11/21   ? ?  ?  ? ?  ? ? ? ? PT Long Term Goals - 10/28/21 1508   ? ?  ? PT LONG TERM GOAL #1  ? Title Ind with advanced HEP   ? Time 8   ? Period Weeks   ? Status New   ? Target Date 12/23/21   ?  ? PT LONG TERM GOAL #2  ? Title Patient able to climb stairs with a reciprocal gait and >= 90% reduction in pain.   ? Time 8   ? Period Weeks   ? Status New   ?  ? PT LONG TERM GOAL #3  ? Title Patient able to sit for work and driving for > one hour with no increase in LBP   ? Time 8   ? Period Weeks   ? Status New   ?  ? PT LONG TERM GOAL #4  ? Title Patient able to perform ADLs with functional lumbar ROM.   ? Time 8   ? Period Weeks   ? Status New   ?  ? PT LONG TERM GOAL #5  ? Title Improved hip strength to 5/5 to be able to peform correct body mechanics with squatting and lifting.   ? Time 8   ? Period Weeks   ? Status New   ?  ? Additional Long Term Goals  ? Additional Long Term Goals Yes   ?  ? PT  LONG TERM GOAL #6  ? Title improved FOTO to 55 from 16 showing improved functional ability.   ? Time 8   ? Period Weeks   ? Status New   ? ?  ?  ? ?  ? ? ? ? ? ? ? ? Plan - 11/10/21 1004   ? ? Clinical Impression Statement Continued core strengthening in supine and in standing. Remains tight throughout bilat hips (R>L). Continued stretching to address this.   ? Personal Factors and Comorbidities Comorbidity 3+   ? Comorbidities DM, HTN, obesity   ? Examination-Activity Limitations Bend;Dressing;Stairs;Sit   ? Examination-Participation Restrictions Driving;Occupation   ? Stability/Clinical Decision Making Stable/Uncomplicated   ? PT Treatment/Interventions ADLs/Self Care Home Management;Aquatic Therapy;Cryotherapy;Electrical Stimulation;Moist Heat;Traction;Neuromuscular re-education;Therapeutic exercise;Therapeutic activities;Functional mobility training;Stair training;Patient/family education;Manual techniques;Dry needling;Taping   ? PT Next Visit Plan progress core strength. manual/modalities as needed   ? PT Home Exercise Plan [email or web address]   ? Consulted and Agree with Plan of Care Patient   ? ?  ?  ? ?  ? ? ?Patient will benefit from skilled therapeutic intervention in order to improve the following deficits and impairments:  Decreased range of motion, Increased muscle spasms, Decreased activity tolerance, Pain, Hypomobility, Impaired flexibility, Decreased strength ? ?Visit Diagnosis: ?Acute bilateral low back pain without sciatica ? ?Cramp and spasm ? ?Muscle weakness (generalized) ? ? ? ? ?Problem List ?There are no problems to display for this patient. ? ? ?Timohty Renbarger April Gordy Levan, PT, DPT ?11/10/2021, 10:37 AM ? ?[place] ?[facility] ?[address] ?[place], [place], [address] ?Phone: [phone]   Fax:  [phone] ? ?Name: Gustavus Basaldua ?MRN: [id number] ?Date of Birth: [birth year] ? ? ? ?

## 2021-11-13 ENCOUNTER — Ambulatory Visit: Payer: Commercial Managed Care - HMO | Admitting: Physical Therapy

## 2021-11-13 DIAGNOSIS — M545 Low back pain, unspecified: Secondary | ICD-10-CM

## 2021-11-13 DIAGNOSIS — M6281 Muscle weakness (generalized): Secondary | ICD-10-CM

## 2021-11-13 DIAGNOSIS — R252 Cramp and spasm: Secondary | ICD-10-CM

## 2021-11-13 NOTE — Therapy (Signed)
[place] ?[facility] ?[address] [place] [address] ?[place], [place], [address] ?Phone: [phone]   Fax:  [phone] ? ?Physical Therapy Treatment ? ?Patient Details  ?Name: Christopher Kaiser ?MRN: [id number] ?Date of Birth: 03/03/1971 ?Referring Provider (PT): Tammy Eartha Inch ? ? ?Encounter Date: 11/13/2021 ? ? PT End of Session - 11/13/21 0935   ? ? Visit Number 6   ? Number of Visits 16   ? Date for PT Re-Evaluation 12/23/21   ? Authorization Type Med Pay /[employer] Managed   ? PT Start Time 0935   ? PT Stop Time 1025   ? PT Time Calculation (min) 50 min   ? Activity Tolerance Patient tolerated treatment well   ? Behavior During Therapy [facility] for tasks assessed/performed   ? ?  ?  ? ?  ? ? ?Past Medical History:  ?Diagnosis Date  ? Diabetes mellitus without complication ([facility])   ? Hypertension   ? Kidney stone   ? ? ?Past Surgical History:  ?Procedure Laterality Date  ? KIDNEY STONE SURGERY    ? ? ?There were no vitals filed for this visit. ? ? Subjective Assessment - 11/13/21 0937   ? ? Subjective Pt states he hasn't done any of his HEP. Has continued to stretch. Pt does note he is able to sit for longer periods without getting tight. He also notes that steps haven't felt as bad as they used to.   ? Pertinent History DM, HTN,   ? How long can you sit comfortably? 15 min   ? Diagnostic tests mild degenerative changes   ? Patient Stated Goals strengthen my core and back, return to PLOF   ? Currently in Pain? Yes   ? Pain Score 2    ? ?  ?  ? ?  ? ? ? ? ? [facility] PT Assessment - 11/13/21 0001   ? ?  ? Assessment  ? Medical Diagnosis low back pain   ? Referring Provider (PT) Tammy Eartha Inch   ? Onset Date/Surgical Date 10/23/21   ? Hand Dominance Left   ? Next MD Visit couple of weeks   ? ?  ?  ? ?  ? ? ? ? ? ? ? ? ? ? ? ? ? ? ? ? [facility] Adult PT Treatment/Exercise - 11/13/21 0001   ? ?  ? Lumbar Exercises: Stretches  ? Passive Hamstring Stretch 2 reps;Left;Right;30 seconds   ? Passive  Hamstring Stretch Limitations supine with strap   ? Lower Trunk Rotation 2 reps;20 seconds   ? Piriformis Stretch 30 seconds   ? Piriformis Stretch Limitations supine   ? Figure 4 Stretch 30 seconds   ? Figure 4 Stretch Limitations supine   ?  ? Lumbar Exercises: Aerobic  ? Nustep L5 x 5 min   ?  ? Lumbar Exercises: Standing  ? Shoulder Extension Strengthening;Theraband;20 reps   ? Theraband Level (Shoulder Extension) Level 3 (Green)   ? Shoulder Extension Limitations 3 sec hold   ? Other Standing Lumbar Exercises Palloff press 2x10 green tband   ?  ? Lumbar Exercises: Supine  ? Ab Set 5 seconds;10 reps   ? Bent Knee Raise 10 reps;3 seconds   ? Bent Knee Raise Limitations alternating   ? Bridge 10 reps   ? Bridge Limitations 3 sec hold at end range   ? ?  ?  ? ?  ? ? ? ? ? ? ? ? ? ? ? ? PT Short Term  Goals - 10/28/21 1508   ? ?  ? PT SHORT TERM GOAL #1  ? Title Ind with initial HEP   ? Time 2   ? Period Weeks   ? Status New   ? Target Date 11/11/21   ? ?  ?  ? ?  ? ? ? ? PT Long Term Goals - 10/28/21 1508   ? ?  ? PT LONG TERM GOAL #1  ? Title Ind with advanced HEP   ? Time 8   ? Period Weeks   ? Status New   ? Target Date 12/23/21   ?  ? PT LONG TERM GOAL #2  ? Title Patient able to climb stairs with a reciprocal gait and >= 90% reduction in pain.   ? Time 8   ? Period Weeks   ? Status New   ?  ? PT LONG TERM GOAL #3  ? Title Patient able to sit for work and driving for > one hour with no increase in LBP   ? Time 8   ? Period Weeks   ? Status New   ?  ? PT LONG TERM GOAL #4  ? Title Patient able to perform ADLs with functional lumbar ROM.   ? Time 8   ? Period Weeks   ? Status New   ?  ? PT LONG TERM GOAL #5  ? Title Improved hip strength to 5/5 to be able to peform correct body mechanics with squatting and lifting.   ? Time 8   ? Period Weeks   ? Status New   ?  ? Additional Long Term Goals  ? Additional Long Term Goals Yes   ?  ? PT LONG TERM GOAL #6  ? Title improved FOTO to 55 from 16 showing improved  functional ability.   ? Time 8   ? Period Weeks   ? Status New   ? ?  ?  ? ?  ? ? ? ? ? ? ? ? Plan - 11/13/21 0957   ? ? Clinical Impression Statement Pt comes in with little pain this morning; notes its because he hasn't yet stood for too long or sat for too long. Has been able to sit for longer periods. Treatment focused on continuing core strengthening and endurance especially in standing. Discussed desk ergonomics to help with his back.   ? Personal Factors and Comorbidities Comorbidity 3+   ? Comorbidities DM, HTN, obesity   ? Examination-Activity Limitations Bend;Dressing;Stairs;Sit   ? Examination-Participation Restrictions Driving;Occupation   ? Stability/Clinical Decision Making Stable/Uncomplicated   ? PT Treatment/Interventions ADLs/Self Care Home Management;Aquatic Therapy;Cryotherapy;Electrical Stimulation;Moist Heat;Traction;Neuromuscular re-education;Therapeutic exercise;Therapeutic activities;Functional mobility training;Stair training;Patient/family education;Manual techniques;Dry needling;Taping   ? PT Next Visit Plan progress core strength. manual/modalities as needed   ? PT Home Exercise Plan [email or web address]   ? Consulted and Agree with Plan of Care Patient   ? ?  ?  ? ?  ? ? ?Patient will benefit from skilled therapeutic intervention in order to improve the following deficits and impairments:  Decreased range of motion, Increased muscle spasms, Decreased activity tolerance, Pain, Hypomobility, Impaired flexibility, Decreased strength ? ?Visit Diagnosis: ?Acute bilateral low back pain without sciatica ? ?Cramp and spasm ? ?Muscle weakness (generalized) ? ? ? ? ?Problem List ?There are no problems to display for this patient. ? ? ?Christopher Kaiser Christopher Kaiser, PT, DPT ?11/13/2021, 10:52 AM ? ?[place] ?[facility] ?[address] [place] [address] ?[place], [place], [address] ?  Phone: [phone]   Fax:  [phone] ? ?Name: Christopher Kaiser ?MRN: [id number] ?Date of Birth:  1971-06-17 ? ? ? ?

## 2021-11-17 ENCOUNTER — Other Ambulatory Visit: Payer: Self-pay

## 2021-11-17 ENCOUNTER — Ambulatory Visit: Payer: Commercial Managed Care - HMO | Admitting: Physical Therapy

## 2021-11-17 DIAGNOSIS — M6281 Muscle weakness (generalized): Secondary | ICD-10-CM

## 2021-11-17 DIAGNOSIS — M545 Low back pain, unspecified: Secondary | ICD-10-CM

## 2021-11-17 DIAGNOSIS — R252 Cramp and spasm: Secondary | ICD-10-CM

## 2021-11-17 NOTE — Therapy (Signed)
[place] ?[facility] ?[address] ?[place], [place], [address] ?Phone: [phone]   Fax:  [phone] ? ?Physical Therapy Treatment ? ?Patient Details  ?Name: Christopher Kaiser ?MRN: [id number] ?Date of Birth: 10/07/1970 ?Referring Provider (PT): Tammy Arn Medal ? ? ?Encounter Date: 11/17/2021 ? ? PT End of Session - 11/17/21 1234   ? ? Visit Number 7   ? Number of Visits 16   ? Date for PT Re-Evaluation 12/23/21   ? Authorization Type Med Pay /[employer] Managed   ? PT Start Time 1145   ? PT Stop Time 1240   ? PT Time Calculation (min) 55 min   ? Activity Tolerance Patient tolerated treatment well   ? Behavior During Therapy [facility] for tasks assessed/performed   ? ?  ?  ? ?  ? ? ?Past Medical History:  ?Diagnosis Date  ? Diabetes mellitus without complication ([place])   ? Hypertension   ? Kidney stone   ? ? ?Past Surgical History:  ?Procedure Laterality Date  ? KIDNEY STONE SURGERY    ? ? ?There were no vitals filed for this visit. ? ? Subjective Assessment - 11/17/21 1150   ? ? Subjective Pt states he feels his back pain is getting better. He states he continues to be able to sit for longer periods without pain/tightness   ? Patient Stated Goals strengthen my core and back, return to PLOF   ? Currently in Pain? Yes   ? Pain Score 2    ? Pain Location Back   ? Pain Orientation Lower   ? Pain Descriptors / Indicators Tightness   ? ?  ?  ? ?  ? ? ? ? ? [facility] PT Assessment - 11/17/21 0001   ? ?  ? Assessment  ? Medical Diagnosis low back pain   ? Referring Provider (PT) Tammy Arn Medal   ? Onset Date/Surgical Date 10/23/21   ? Hand Dominance Left   ? Next MD Visit July 2023   ?  ? Flexibility  ? Hamstrings decreased bilat   ? Piriformis decreased bilat   ?  ? Palpation  ? Palpation comment increased mm spasticity lumbar paraspinals   ? ?  ?  ? ?  ? ? ? ? ? ? ? ? ? ? ? ? ? ? ? ? [place] Adult PT Treatment/Exercise - 11/17/21 0001   ? ?  ? Lumbar Exercises: Stretches  ? Passive  Hamstring Stretch 2 reps;Left;Right;30 seconds   ? Passive Hamstring Stretch Limitations supine with strap   ? Lower Trunk Rotation 2 reps;20 seconds   ? Piriformis Stretch Right;Left;30 seconds   ? Piriformis Stretch Limitations supine   ? Figure 4 Stretch 30 seconds   ? Figure 4 Stretch Limitations supine   ?  ? Lumbar Exercises: Aerobic  ? Nustep L5 x 5 min   ?  ? Lumbar Exercises: Standing  ? Shoulder Extension 20 reps   ? Theraband Level (Shoulder Extension) Level 3 (Green)   ? Shoulder Extension Limitations 3 sec hold   ? Other Standing Lumbar Exercises Palloff press 2x10 green tband   ?  ? Lumbar Exercises: Seated  ? Other Seated Lumbar Exercises 5 reps holding 2kg med ball   ?  ? Lumbar Exercises: Supine  ? Bent Knee Raise 10 reps;3 seconds   ? Bent Knee Raise Limitations alternating   ? Bridge 10 reps   ? Bridge Limitations 3 sec hold end range   ?  ?  Moist Heat Therapy  ? Number Minutes Moist Heat 10 Minutes   ? Moist Heat Location Lumbar Spine   ?  ? Electrical Stimulation  ? Electrical Stimulation Location lumbar   ? Electrical Stimulation Action IFC   ? Electrical Stimulation Parameters to tolerance   ? Electrical Stimulation Goals Pain   ?  ? Manual Therapy  ? Manual Therapy Joint mobilization   ? Manual therapy comments skilled palpation to assess effects of dry needling   ? Joint Mobilization grade 2-3 SIJ CPAs and UPAs on Rt to improve mobility and decrease pain   ? Soft tissue mobilization STM bilat lumbar paraspinals   ? ?  ?  ? ?  ? ? ? Trigger Point Dry Needling - 11/17/21 0001   ? ? Consent Given? Yes   ? Education Handout Provided Previously provided   ? Gluteus Maximus Response Palpable increased muscle length   ? Erector spinae Response Palpable increased muscle length   ? Lumbar multifidi Response Palpable increased muscle length   ? ?  ?  ? ?  ? ? ? ? ? ? ? ? ? ? PT Short Term Goals - 10/28/21 1508   ? ?  ? PT SHORT TERM GOAL #1  ? Title Ind with initial HEP   ? Time 2   ? Period Weeks   ?  Status New   ? Target Date 11/11/21   ? ?  ?  ? ?  ? ? ? ? PT Long Term Goals - 10/28/21 1508   ? ?  ? PT LONG TERM GOAL #1  ? Title Ind with advanced HEP   ? Time 8   ? Period Weeks   ? Status New   ? Target Date 12/23/21   ?  ? PT LONG TERM GOAL #2  ? Title Patient able to climb stairs with a reciprocal gait and >= 90% reduction in pain.   ? Time 8   ? Period Weeks   ? Status New   ?  ? PT LONG TERM GOAL #3  ? Title Patient able to sit for work and driving for > one hour with no increase in LBP   ? Time 8   ? Period Weeks   ? Status New   ?  ? PT LONG TERM GOAL #4  ? Title Patient able to perform ADLs with functional lumbar ROM.   ? Time 8   ? Period Weeks   ? Status New   ?  ? PT LONG TERM GOAL #5  ? Title Improved hip strength to 5/5 to be able to peform correct body mechanics with squatting and lifting.   ? Time 8   ? Period Weeks   ? Status New   ?  ? Additional Long Term Goals  ? Additional Long Term Goals Yes   ?  ? PT LONG TERM GOAL #6  ? Title improved FOTO to 55 from 16 showing improved functional ability.   ? Time 8   ? Period Weeks   ? Status New   ? ?  ?  ? ?  ? ? ? ? ? ? ? ? Plan - 11/17/21 1234   ? ? Clinical Impression Statement Pt with decreased pain and improving core strength. Able to tolerate addition of sit to stand with med ball with increased fatigue and cues for posture and technique. Dry needling today to lumbar paraspinals on Rt to decrease pain and spasticity   ? PT  Next Visit Plan progress core strength. manual/modalities as needed   ? PT Home Exercise Plan [email or web address]   ? Consulted and Agree with Plan of Care Patient   ? ?  ?  ? ?  ? ? ?Patient will benefit from skilled therapeutic intervention in order to improve the following deficits and impairments:    ? ?Visit Diagnosis: ?Acute bilateral low back pain without sciatica ? ?Cramp and spasm ? ?Muscle weakness (generalized) ? ? ? ? ?Problem List ?There are no problems to display for this patient. ? ? ?Mazel Villela, PT ?11/17/2021, 12:36  PM ? ?[place] ?[facility] ?[place], [place], [address] ?Phone: [phone]   Fax:  [phone] ? ?Name: Marian Denk ?MRN: [id number] ?Date of Birth: 07/20/1971 ? ? ? ?

## 2021-11-20 ENCOUNTER — Other Ambulatory Visit: Payer: Self-pay

## 2021-11-20 ENCOUNTER — Ambulatory Visit: Payer: Commercial Managed Care - HMO | Admitting: Physical Therapy

## 2021-11-20 DIAGNOSIS — R252 Cramp and spasm: Secondary | ICD-10-CM

## 2021-11-20 DIAGNOSIS — M6281 Muscle weakness (generalized): Secondary | ICD-10-CM

## 2021-11-20 DIAGNOSIS — M545 Low back pain, unspecified: Secondary | ICD-10-CM | POA: Diagnosis not present

## 2021-11-20 NOTE — Therapy (Signed)
[place] ?[facility] ?[address] ?[place], [place], [address] ?Phone: [phone]   Fax:  [phone] ? ?Physical Therapy Treatment ? ?Patient Details  ?Name: Christopher Kaiser ?MRN: [id number] ?Date of Birth: 09-Feb-1971 ?Referring Provider (PT): Tammy Arn Medal ? ? ?Encounter Date: 11/20/2021 ? ? PT End of Session - 11/20/21 1016   ? ? Visit Number 8   ? Number of Visits 16   ? Date for PT Re-Evaluation 12/23/21   ? Authorization Type Med Pay /[employer] Managed   ? PT Start Time 0930   ? PT Stop Time 1024   ? PT Time Calculation (min) 54 min   ? Activity Tolerance Patient tolerated treatment well   ? Behavior During Therapy [facility] for tasks assessed/performed   ? ?  ?  ? ?  ? ? ?Past Medical History:  ?Diagnosis Date  ? Diabetes mellitus without complication ([facility])   ? Hypertension   ? Kidney stone   ? ? ?Past Surgical History:  ?Procedure Laterality Date  ? KIDNEY STONE SURGERY    ? ? ?There were no vitals filed for this visit. ? ? Subjective Assessment - 11/20/21 0937   ? ? Subjective Pt states he feels better than he normally does in the morning. He states he thinks the needling helps   ? Patient Stated Goals strengthen my core and back, return to PLOF   ? Currently in Pain? No/denies   ? ?  ?  ? ?  ? ? ? ? ? [facility] PT Assessment - 11/20/21 0001   ? ?  ? Assessment  ? Medical Diagnosis low back pain   ? Referring Provider (PT) Tammy Arn Medal   ? Onset Date/Surgical Date 10/23/21   ? Hand Dominance Left   ? Next MD Visit July 2023   ?  ? AROM  ? Lumbar Flexion hands to mid shin   ? ?  ?  ? ?  ? ? ? ? ? ? ? ? ? ? ? ? ? ? ? ? [place] Adult PT Treatment/Exercise - 11/20/21 0001   ? ?  ? Lumbar Exercises: Stretches  ? Lower Trunk Rotation 2 reps;20 seconds   ? Piriformis Stretch Right;Left;30 seconds   ? Piriformis Stretch Limitations supine   ? Figure 4 Stretch 30 seconds   ? Figure 4 Stretch Limitations supine   ?  ? Lumbar Exercises: Aerobic  ? Nustep L5 x 5 min   ?  ?  Lumbar Exercises: Standing  ? Shoulder Extension 20 reps   ? Theraband Level (Shoulder Extension) Level 4 (Blue)   ? Shoulder Extension Limitations 3 sec hold   ? Other Standing Lumbar Exercises pallof press 2 x 10 blue TB   ? Other Standing Lumbar Exercises chops green TB x 10 bilat   ?  ? Lumbar Exercises: Seated  ? Other Seated Lumbar Exercises 10 reps holding 2kg med ball   ?  ? Lumbar Exercises: Supine  ? Bent Knee Raise 10 reps;3 seconds   ? Bent Knee Raise Limitations alternating   ? Bridge 10 reps   ? Bridge Limitations 3 sec hold end range   ?  ? Moist Heat Therapy  ? Number Minutes Moist Heat 10 Minutes   ? Moist Heat Location Lumbar Spine   ?  ? Electrical Stimulation  ? Electrical Stimulation Location lumbar   ? Electrical Stimulation Action IFC   ? Electrical Stimulation Parameters to tolerance   ? Electrical Stimulation Goals Pain   ?  ?  Manual Therapy  ? Joint Mobilization grade 2-3 SIJ CPAs and UPAs on Lt to improve mobility and decrease pain   ? Soft tissue mobilization STM bilat lumbar paraspinals, Lt glutes and QL   ? ?  ?  ? ?  ? ? ? ? ? ? ? ? ? ? ? ? PT Short Term Goals - 10/28/21 1508   ? ?  ? PT SHORT TERM GOAL #1  ? Title Ind with initial HEP   ? Time 2   ? Period Weeks   ? Status New   ? Target Date 11/11/21   ? ?  ?  ? ?  ? ? ? ? PT Long Term Goals - 10/28/21 1508   ? ?  ? PT LONG TERM GOAL #1  ? Title Ind with advanced HEP   ? Time 8   ? Period Weeks   ? Status New   ? Target Date 12/23/21   ?  ? PT LONG TERM GOAL #2  ? Title Patient able to climb stairs with a reciprocal gait and >= 90% reduction in pain.   ? Time 8   ? Period Weeks   ? Status New   ?  ? PT LONG TERM GOAL #3  ? Title Patient able to sit for work and driving for > one hour with no increase in LBP   ? Time 8   ? Period Weeks   ? Status New   ?  ? PT LONG TERM GOAL #4  ? Title Patient able to perform ADLs with functional lumbar ROM.   ? Time 8   ? Period Weeks   ? Status New   ?  ? PT LONG TERM GOAL #5  ? Title Improved hip  strength to 5/5 to be able to peform correct body mechanics with squatting and lifting.   ? Time 8   ? Period Weeks   ? Status New   ?  ? Additional Long Term Goals  ? Additional Long Term Goals Yes   ?  ? PT LONG TERM GOAL #6  ? Title improved FOTO to 55 from 16 showing improved functional ability.   ? Time 8   ? Period Weeks   ? Status New   ? ?  ?  ? ?  ? ? ? ? ? ? ? ? Plan - 11/20/21 1016   ? ? Clinical Impression Statement Pt with decreased pain after dry needling last session. Able to increase resistance for core work and add chop/lift today. Pt sore after exercises but no "pain"   ? PT Next Visit Plan progress core strength and endurance. manual/modalities as needed   ? PT Home Exercise Plan [email or web address]   ? Consulted and Agree with Plan of Care Patient   ? ?  ?  ? ?  ? ? ?Patient will benefit from skilled therapeutic intervention in order to improve the following deficits and impairments:    ? ?Visit Diagnosis: ?Acute bilateral low back pain without sciatica ? ?Cramp and spasm ? ?Muscle weakness (generalized) ? ? ? ? ?Problem List ?There are no problems to display for this patient. ? ? ?Chae Shuster, PT ?11/20/2021, 10:18 AM ? ?[place] ?[facility] ?[place] ?[place], [place], [address] ?Phone: [phone]   Fax:  [phone] ? ?Name: Christopher Kaiser ?MRN: [id number] ?Date of Birth: [birth year] ? ? ? ?

## 2021-11-24 ENCOUNTER — Other Ambulatory Visit: Payer: Self-pay

## 2021-11-24 ENCOUNTER — Ambulatory Visit: Payer: Commercial Managed Care - HMO | Admitting: Physical Therapy

## 2021-11-24 DIAGNOSIS — M6281 Muscle weakness (generalized): Secondary | ICD-10-CM

## 2021-11-24 DIAGNOSIS — M545 Low back pain, unspecified: Secondary | ICD-10-CM | POA: Diagnosis not present

## 2021-11-24 DIAGNOSIS — R252 Cramp and spasm: Secondary | ICD-10-CM

## 2021-11-24 NOTE — Therapy (Signed)
[place] ?[facility] ?[address] ?[place], [place], [address] ?Phone: [phone]   Fax:  [phone] ? ?Physical Therapy Treatment ? ?Patient Details  ?Name: Christopher Kaiser ?MRN: [id number] ?Date of Birth: Aug 28, 1971 ?Referring Provider (PT): Tammy Arn Medal ? ? ?Encounter Date: 11/24/2021 ? ? PT End of Session - 11/24/21 1237   ? ? Visit Number 9   ? Number of Visits 16   ? Date for PT Re-Evaluation 12/23/21   ? Authorization Type Med Pay /[employer] Managed   ? PT Start Time 1145   ? PT Stop Time 1240   ? PT Time Calculation (min) 55 min   ? Activity Tolerance Patient tolerated treatment well   ? Behavior During Therapy [facility] for tasks assessed/performed   ? ?  ?  ? ?  ? ? ?Past Medical History:  ?Diagnosis Date  ? Diabetes mellitus without complication ([place])   ? Hypertension   ? Kidney stone   ? ? ?Past Surgical History:  ?Procedure Laterality Date  ? KIDNEY STONE SURGERY    ? ? ?There were no vitals filed for this visit. ? ? Subjective Assessment - 11/24/21 1149   ? ? Subjective Pt states he worked in his yard this weekend and feels "tight" in his hamstrings. He states his back feels "pretty good"   ? Patient Stated Goals strengthen my core and back, return to PLOF   ? Currently in Pain? Yes   ? Pain Score 3    ? Pain Location Leg   hamstrings  ? Pain Orientation Posterior   ? Pain Descriptors / Indicators Tightness   ? ?  ?  ? ?  ? ? ? ? ? [facility] PT Assessment - 11/24/21 0001   ? ?  ? Assessment  ? Medical Diagnosis low back pain   ? Referring Provider (PT) Tammy Arn Medal   ? Onset Date/Surgical Date 10/23/21   ? Hand Dominance Left   ? Next MD Visit July 2023   ?  ? AROM  ? Lumbar Flexion hands to top of feet   ? ?  ?  ? ?  ? ? ? ? ? ? ? ? ? ? ? ? ? ? ? ? [facility] Adult PT Treatment/Exercise - 11/24/21 0001   ? ?  ? Lumbar Exercises: Stretches  ? Passive Hamstring Stretch Right;Left;2 reps;30 seconds   ? Lower Trunk Rotation 3 reps;20 seconds   ? Piriformis Stretch  Right;Left;30 seconds   ? Piriformis Stretch Limitations supine   ? Figure 4 Stretch 30 seconds   ? Figure 4 Stretch Limitations supine   ?  ? Lumbar Exercises: Aerobic  ? Nustep L5 x 3 min for warm up   ?  ? Lumbar Exercises: Standing  ? Other Standing Lumbar Exercises farmers carry 15# x 2 laps, modified dead lift 15# x 10 to 12'' step   ? Other Standing Lumbar Exercises sidestep red TB x 1 lap   ?  ? Lumbar Exercises: Seated  ? Other Seated Lumbar Exercises 10 reps holding 3kg med ball   ?  ? Lumbar Exercises: Supine  ? Bridge with March 10 reps   ?  ? Electrical Stimulation  ? Electrical Stimulation Location hamstrings   ? Electrical Stimulation Action TENS   ? Electrical Stimulation Parameters to tolerance   ? Electrical Stimulation Goals Pain;Tone   ?  ? Manual Therapy  ? Soft tissue mobilization STM bilat hamstrings   ? ?  ?  ? ?  ? ? ? ? ? ? ? ? ? ?  PT Education - 11/24/21 1235   ? ? Education Details updated HEP   ? Person(s) Educated Patient   ? Methods Explanation;Demonstration;Handout   ? Comprehension Returned demonstration;Verbalized understanding   ? ?  ?  ? ?  ? ? ? PT Short Term Goals - 10/28/21 1508   ? ?  ? PT SHORT TERM GOAL #1  ? Title Ind with initial HEP   ? Time 2   ? Period Weeks   ? Status New   ? Target Date 11/11/21   ? ?  ?  ? ?  ? ? ? ? PT Long Term Goals - 10/28/21 1508   ? ?  ? PT LONG TERM GOAL #1  ? Title Ind with advanced HEP   ? Time 8   ? Period Weeks   ? Status New   ? Target Date 12/23/21   ?  ? PT LONG TERM GOAL #2  ? Title Patient able to climb stairs with a reciprocal gait and >= 90% reduction in pain.   ? Time 8   ? Period Weeks   ? Status New   ?  ? PT LONG TERM GOAL #3  ? Title Patient able to sit for work and driving for > one hour with no increase in LBP   ? Time 8   ? Period Weeks   ? Status New   ?  ? PT LONG TERM GOAL #4  ? Title Patient able to perform ADLs with functional lumbar ROM.   ? Time 8   ? Period Weeks   ? Status New   ?  ? PT LONG TERM GOAL #5  ? Title  Improved hip strength to 5/5 to be able to peform correct body mechanics with squatting and lifting.   ? Time 8   ? Period Weeks   ? Status New   ?  ? Additional Long Term Goals  ? Additional Long Term Goals Yes   ?  ? PT LONG TERM GOAL #6  ? Title improved FOTO to 55 from 16 showing improved functional ability.   ? Time 8   ? Period Weeks   ? Status New   ? ?  ?  ? ?  ? ? ? ? ? ? ? ? Plan - 11/24/21 1237   ? ? Clinical Impression Statement Pt with increased hamstring tightness this visit. Added hip and glute strengthening with pt with good tolerance.  Manual and modalities focued on decreasing pain and tone in bilat hamstrings this visit. HEP updated   ? PT Next Visit Plan hip and core strength and flexibility   ? PT Home Exercise Plan [email or web address]   ? Consulted and Agree with Plan of Care Patient   ? ?  ?  ? ?  ? ? ?Patient will benefit from skilled therapeutic intervention in order to improve the following deficits and impairments:    ? ?Visit Diagnosis: ?Acute bilateral low back pain without sciatica ? ?Cramp and spasm ? ?Muscle weakness (generalized) ? ? ? ? ?Problem List ?There are no problems to display for this patient. ? ? ?Roselina Burgueno, PT ?11/24/2021, 12:39 PM ? ?[place] ?[facility] ?[address] ?[place], [place], [address] ?Phone: [phone]   Fax:  [phone] ? ?Name: Christopher Kaiser ?MRN: [id number] ?Date of Birth: 09-09-1970 ? ? ? ?

## 2021-11-24 NOTE — Patient Instructions (Signed)
Access Code: [email or web address] ?URL: [email or web address] ?Date: 11/24/2021 ?Prepared by: Isabelle Course ? ?Exercises ?- Supine Double Knee to Chest  - 3 x daily - 7 x weekly - 1 sets - 3 reps - 15 sec hold ?- Hooklying Single Knee to Chest Stretch  - 3 x daily - 7 x weekly - 1 sets - 3 reps - 20-30 sec hold ?- Supine Lower Trunk Rotation  - 2 x daily - 7 x weekly - 1 sets - 5 reps - 10 sec hold ?- Seated Piriformis Stretch with Trunk Bend  - 2 x daily - 7 x weekly - 1 sets - 3 reps - 30-60 sec  hold ?- Standing Anti-Rotation Press with Anchored Resistance  - 1 x daily - 7 x weekly - 2 sets - 10 reps ?- Shoulder extension with resistance - Neutral  - 1 x daily - 7 x weekly - 2 sets - 10 reps ?- Standing Diagonal Chops with Medicine Ball  - 1 x daily - 7 x weekly - 3 sets - 10 reps ?- Marching Bridge  - 1 x daily - 7 x weekly - 2 sets - 10 reps ?- Side Stepping with Resistance at Ankles  - 1 x daily - 7 x weekly - 2 sets - 10 reps ?- Goblet Squat with Kettlebell  - 1 x daily - 7 x weekly - 2 sets - 10 reps ?

## 2021-11-27 ENCOUNTER — Ambulatory Visit: Payer: Commercial Managed Care - HMO | Admitting: Physical Therapy

## 2021-11-27 DIAGNOSIS — M545 Low back pain, unspecified: Secondary | ICD-10-CM | POA: Diagnosis not present

## 2021-11-27 DIAGNOSIS — R252 Cramp and spasm: Secondary | ICD-10-CM

## 2021-11-27 DIAGNOSIS — M6281 Muscle weakness (generalized): Secondary | ICD-10-CM

## 2021-11-27 NOTE — Therapy (Signed)
[place] ?[facility] ?[address] ?[place], [place], [address] ?Phone: [phone]   Fax:  [phone] ? ?Physical Therapy Treatment ? ?Patient Details  ?Name: Christopher Kaiser ?MRN: [id number] ?Date of Birth: 12-13-1970 ?Referring Provider (PT): Tammy Arn Medal ? ? ?Encounter Date: 11/27/2021 ? ? PT End of Session - 11/27/21 1012   ? ? Visit Number 10   ? Number of Visits 16   ? Date for PT Re-Evaluation 12/23/21   ? Authorization Type Med Pay /[employer] Managed   ? PT Start Time 0930   ? PT Stop Time 1020   ? PT Time Calculation (min) 50 min   ? Activity Tolerance Patient tolerated treatment well   ? Behavior During Therapy [facility] for tasks assessed/performed   ? ?  ?  ? ?  ? ? ?Past Medical History:  ?Diagnosis Date  ? Diabetes mellitus without complication ([place])   ? Hypertension   ? Kidney stone   ? ? ?Past Surgical History:  ?Procedure Laterality Date  ? KIDNEY STONE SURGERY    ? ? ?There were no vitals filed for this visit. ? ? Subjective Assessment - 11/27/21 0934   ? ? Subjective Pt states his Rt hamstring continues to be tight   ? Patient Stated Goals strengthen my core and back, return to PLOF   ? Currently in Pain? Yes   ? Pain Score 6    ? Pain Location Leg   ? Pain Orientation Right;Posterior   ? Pain Descriptors / Indicators Tightness   ? ?  ?  ? ?  ? ? ? ? ? ? ? ? ? ? ? ? ? ? ? ? ? ? ? ? [place] Adult PT Treatment/Exercise - 11/27/21 0001   ? ?  ? Lumbar Exercises: Stretches  ? Passive Hamstring Stretch Right;Left;2 reps;30 seconds   ? Lower Trunk Rotation 3 reps;20 seconds   ? Piriformis Stretch Right;Left;30 seconds   ? Piriformis Stretch Limitations supine   ? Figure 4 Stretch 30 seconds   ? Figure 4 Stretch Limitations supine   ?  ? Lumbar Exercises: Standing  ? Shoulder Extension 20 reps   ? Theraband Level (Shoulder Extension) Level 4 (Blue)   ? Shoulder Extension Limitations 3 sec hold   ? Other Standing Lumbar Exercises pallof press 2 x 10 blue TB   ?  Other Standing Lumbar Exercises chop lift 2kg med ball x 10 bilat   ?  ? Lumbar Exercises: Seated  ? Other Seated Lumbar Exercises 10 reps holding 3kg med ball   ?  ? Moist Heat Therapy  ? Number Minutes Moist Heat 10 Minutes   ? Moist Heat Location --   hamstring  ?  ? Electrical Stimulation  ? Electrical Stimulation Location hamstrings   ? Electrical Stimulation Action TENS   ? Electrical Stimulation Parameters to tolerance   ? Electrical Stimulation Goals Pain;Tone   ?  ? Manual Therapy  ? Soft tissue mobilization STM and TPR Rt hamstring   ? ?  ?  ? ?  ? ? ? ? ? ? ? ? ? ? ? ? PT Short Term Goals - 10/28/21 1508   ? ?  ? PT SHORT TERM GOAL #1  ? Title Ind with initial HEP   ? Time 2   ? Period Weeks   ? Status New   ? Target Date 11/11/21   ? ?  ?  ? ?  ? ? ? ? PT Long  Term Goals - 10/28/21 1508   ? ?  ? PT LONG TERM GOAL #1  ? Title Ind with advanced HEP   ? Time 8   ? Period Weeks   ? Status New   ? Target Date 12/23/21   ?  ? PT LONG TERM GOAL #2  ? Title Patient able to climb stairs with a reciprocal gait and >= 90% reduction in pain.   ? Time 8   ? Period Weeks   ? Status New   ?  ? PT LONG TERM GOAL #3  ? Title Patient able to sit for work and driving for > one hour with no increase in LBP   ? Time 8   ? Period Weeks   ? Status New   ?  ? PT LONG TERM GOAL #4  ? Title Patient able to perform ADLs with functional lumbar ROM.   ? Time 8   ? Period Weeks   ? Status New   ?  ? PT LONG TERM GOAL #5  ? Title Improved hip strength to 5/5 to be able to peform correct body mechanics with squatting and lifting.   ? Time 8   ? Period Weeks   ? Status New   ?  ? Additional Long Term Goals  ? Additional Long Term Goals Yes   ?  ? PT LONG TERM GOAL #6  ? Title improved FOTO to 55 from 16 showing improved functional ability.   ? Time 8   ? Period Weeks   ? Status New   ? ?  ?  ? ?  ? ? ? ? ? ? ? ? Plan - 11/27/21 1013   ? ? Clinical Impression Statement Pt continues with increased mm spasticity in Rt hamstring. Able to  tolerate core strengthening. good response to manual work on Rt hamstring with decreased pain after session   ? PT Next Visit Plan hip and core strength and flexibility   ? PT Home Exercise Plan [email or web address]   ? Consulted and Agree with Plan of Care Patient   ? ?  ?  ? ?  ? ? ?Patient will benefit from skilled therapeutic intervention in order to improve the following deficits and impairments:    ? ?Visit Diagnosis: ?Acute bilateral low back pain without sciatica ? ?Cramp and spasm ? ?Muscle weakness (generalized) ? ? ? ? ?Problem List ?There are no problems to display for this patient. ? ? ?Gaetano Romberger, PT ?11/27/2021, 10:14 AM ? ?[place] ?[facility] ?[address] ?[place], [place], [address] ?Phone: [phone]   Fax:  [phone] ? ?Name: Christopher Kaiser ?MRN: [id number] ?Date of Birth: 04/15/1971 ? ? ? ?

## 2021-12-02 ENCOUNTER — Encounter: Payer: Self-pay | Admitting: Rehabilitative and Restorative Service Providers"

## 2021-12-02 ENCOUNTER — Ambulatory Visit
Payer: Commercial Managed Care - HMO | Attending: Family Medicine | Admitting: Rehabilitative and Restorative Service Providers"

## 2021-12-02 DIAGNOSIS — R252 Cramp and spasm: Secondary | ICD-10-CM | POA: Insufficient documentation

## 2021-12-02 DIAGNOSIS — M6281 Muscle weakness (generalized): Secondary | ICD-10-CM | POA: Diagnosis present

## 2021-12-02 DIAGNOSIS — M545 Low back pain, unspecified: Secondary | ICD-10-CM | POA: Insufficient documentation

## 2021-12-02 NOTE — Therapy (Signed)
[place] ?[facility] ?[address] ?[place], [place], [address] ?Phone: [phone]   Fax:  [phone] ? ?Physical Therapy Treatment ? ?Patient Details  ?Name: Christopher Kaiser ?MRN: [id number] ?Date of Birth: Apr 01, 1971 ?Referring Provider (PT): Tammy Arn Medal ? ? ?Encounter Date: 12/02/2021 ? ? PT End of Session - 12/02/21 1149   ? ? Visit Number 11   ? Number of Visits 16   ? Date for PT Re-Evaluation 12/23/21   ? Authorization Type Med Pay /[employer] Managed   ? PT Start Time 1145   ? PT Stop Time 1233   ? PT Time Calculation (min) 48 min   ? ?  ?  ? ?  ? ? ?Past Medical History:  ?Diagnosis Date  ? Diabetes mellitus without complication ([place])   ? Hypertension   ? Kidney stone   ? ? ?Past Surgical History:  ?Procedure Laterality Date  ? KIDNEY STONE SURGERY    ? ? ?There were no vitals filed for this visit. ? ? Subjective Assessment - 12/02/21 1151   ? ? Subjective Making progress. Back is not hurting as much as it was. Working on his core and his exercises.   ? Currently in Pain? Yes   ? Pain Score 3    ? Pain Location Back   ? Pain Orientation Mid   ? Pain Descriptors / Indicators Dull   ? Pain Type Acute pain   ? Pain Onset More than a month ago   ? Pain Frequency Intermittent   ? Aggravating Factors  sitting in the car; bending   ? Pain Relieving Factors standing; walking   ? ?  ?  ? ?  ? ? ? ? ? [facility] PT Assessment - 12/02/21 0001   ? ?  ? Assessment  ? Medical Diagnosis low back pain   ? Referring Provider (PT) Tammy Arn Medal   ? Onset Date/Surgical Date 10/23/21   ? Hand Dominance Left   ? Next MD Visit July 2023   ? ?  ?  ? ?  ? ? ? ? ? ? ? ? ? ? ? ? ? ? ? ? [place] Adult PT Treatment/Exercise - 12/02/21 0001   ? ?  ? Self-Care  ? Other Self-Care Comments  15# KB lift x 10 from 8 in stool VC for hinge hip with core engaged   ?  ? Therapeutic Activites   ? Therapeutic Activities Other Therapeutic Activities   ? Other Therapeutic Activities trial myofacial ball  relesae work anterior hip pt in prone   ?  ? Lumbar Exercises: Stretches  ? Passive Hamstring Stretch Right;Left;2 reps;30 seconds   ? Lower Trunk Rotation 3 reps;20 seconds   ? Quad Stretch Right;Left;2 reps;20 seconds   ? Quad Stretch Limitations PT assist for quad stretch some tightness and pain with Rt - improved w/ ball release work Rt hip flexors   ?  ? Lumbar Exercises: Aerobic  ? Nustep L6 x 5 min UE 11   ?  ? Lumbar Exercises: Standing  ? Shoulder Extension 20 reps   ? Theraband Level (Shoulder Extension) Level 4 (Blue)   ? Shoulder Extension Limitations 3 sec hold   ? Other Standing Lumbar Exercises pallof press 2 x 10 blue TB 3 sec hold   ? Other Standing Lumbar Exercises chop lift 2.2 # red ball x 10 bilat   ?  ? Lumbar Exercises: Seated  ? Other Seated Lumbar Exercises 10 reps holding 5 #  KB   ?  ? Moist Heat Therapy  ? Number Minutes Moist Heat 10 Minutes   ? Moist Heat Location Lumbar Spine   ?  ? Manual Therapy  ? Joint Mobilization PA mobs lumbar and Rt sacrum   ? Soft tissue mobilization working through the Rt lumbar paraspinals into the QL and lats   ? ?  ?  ? ?  ? ? ? ? ? ? ? ? ? ? ? ? PT Short Term Goals - 10/28/21 1508   ? ?  ? PT SHORT TERM GOAL #1  ? Title Ind with initial HEP   ? Time 2   ? Period Weeks   ? Status New   ? Target Date 11/11/21   ? ?  ?  ? ?  ? ? ? ? PT Long Term Goals - 10/28/21 1508   ? ?  ? PT LONG TERM GOAL #1  ? Title Ind with advanced HEP   ? Time 8   ? Period Weeks   ? Status New   ? Target Date 12/23/21   ?  ? PT LONG TERM GOAL #2  ? Title Patient able to climb stairs with a reciprocal gait and >= 90% reduction in pain.   ? Time 8   ? Period Weeks   ? Status New   ?  ? PT LONG TERM GOAL #3  ? Title Patient able to sit for work and driving for > one hour with no increase in LBP   ? Time 8   ? Period Weeks   ? Status New   ?  ? PT LONG TERM GOAL #4  ? Title Patient able to perform ADLs with functional lumbar ROM.   ? Time 8   ? Period Weeks   ? Status New   ?  ? PT LONG  TERM GOAL #5  ? Title Improved hip strength to 5/5 to be able to peform correct body mechanics with squatting and lifting.   ? Time 8   ? Period Weeks   ? Status New   ?  ? Additional Long Term Goals  ? Additional Long Term Goals Yes   ?  ? PT LONG TERM GOAL #6  ? Title improved FOTO to 55 from 16 showing improved functional ability.   ? Time 8   ? Period Weeks   ? Status New   ? ?  ?  ? ?  ? ? ? ? ? ? ? ? Plan - 12/02/21 1153   ? ? Clinical Impression Statement Improving back pain with increased activity tolerance. Patient has improved mobility and tissue extensibility. Some continued mild tightness in the Rt hamstrings. Tightness with palpation through the Rt lumbar area. Added hinged hip lift with weight working on proper lifting mechanics.   ? Rehab Potential Good   ? PT Frequency 2x / week   ? PT Duration 8 weeks   ? PT Treatment/Interventions ADLs/Self Care Home Management;Aquatic Therapy;Cryotherapy;Electrical Stimulation;Moist Heat;Traction;Neuromuscular re-education;Therapeutic exercise;Therapeutic activities;Functional mobility training;Stair training;Patient/family education;Manual techniques;Dry needling;Taping   ? PT Next Visit Plan hip and core strength and flexibility   ? PT Home Exercise Plan [email or web address]   ? Consulted and Agree with Plan of Care Patient   ? ?  ?  ? ?  ? ? ?Patient will benefit from skilled therapeutic intervention in order to improve the following deficits and impairments:    ? ?Visit Diagnosis: ?Acute bilateral low back pain without sciatica ? ?Cramp and  spasm ? ?Muscle weakness (generalized) ? ? ? ? ?Problem List ?There are no problems to display for this patient. ? ? ?Everardo All, PT, MPH  ?12/02/2021, 12:29 PM ? ?[place] ?[facility] ?[address] ?[place], [place], [address] ?Phone: [phone]   Fax:  [phone] ? ?Name: Christopher Kaiser ?MRN: [id number] ?Date of Birth: 02-Jun-1971 ? ? ? ?

## 2021-12-04 ENCOUNTER — Ambulatory Visit: Payer: Commercial Managed Care - HMO | Admitting: Physical Therapy

## 2021-12-04 DIAGNOSIS — M545 Low back pain, unspecified: Secondary | ICD-10-CM

## 2021-12-04 DIAGNOSIS — M6281 Muscle weakness (generalized): Secondary | ICD-10-CM

## 2021-12-04 DIAGNOSIS — R252 Cramp and spasm: Secondary | ICD-10-CM

## 2021-12-04 NOTE — Therapy (Signed)
[place] ?[facility] ?[place] ?[place], [place], [address] ?Phone: [phone]   Fax:  [phone] ? ?Physical Therapy Treatment ? ?Patient Details  ?Name: Christopher Kaiser ?MRN: [id number] ?Date of Birth: Jan 25, 1971 ?Referring Provider (PT): Tammy Arn Medal ? ? ?Encounter Date: 12/04/2021 ? ? PT End of Session - 12/04/21 1232   ? ? Visit Number 12   ? Number of Visits 16   ? Date for PT Re-Evaluation 12/23/21   ? Authorization Type Med Pay /[employer] Managed   ? PT Start Time 1145   ? PT Stop Time 1232   ? PT Time Calculation (min) 47 min   ? Activity Tolerance Patient tolerated treatment well   ? Behavior During Therapy [facility] for tasks assessed/performed   ? ?  ?  ? ?  ? ? ?Past Medical History:  ?Diagnosis Date  ? Diabetes mellitus without complication ([place])   ? Hypertension   ? Kidney stone   ? ? ?Past Surgical History:  ?Procedure Laterality Date  ? KIDNEY STONE SURGERY    ? ? ?There were no vitals filed for this visit. ? ? Subjective Assessment - 12/04/21 1148   ? ? Subjective Pt states he feels tight but feels his core is getting stronger. Had some back pain with prone quad stretch   ? Patient Stated Goals strengthen my core and back, return to PLOF   ? Currently in Pain? Yes   ? Pain Score 3    ? Pain Location Back   ? Pain Orientation Lower;Mid   ? Pain Descriptors / Indicators Tightness   ? ?  ?  ? ?  ? ? ? ? ? ? ? ? ? ? ? ? ? ? ? ? ? ? ? ? [facility] Adult PT Treatment/Exercise - 12/04/21 0001   ? ?  ? Lumbar Exercises: Stretches  ? Lower Trunk Rotation 3 reps;20 seconds   ? Hip Flexor Stretch Right;Left;2 reps;30 seconds   ? Hip Flexor Stretch Limitations seated and standing   ? Quad Stretch Right;Left;2 reps;20 seconds   ? Quad Stretch Limitations PT assist for quad stretch   ?  ? Lumbar Exercises: Standing  ? Other Standing Lumbar Exercises pallof press 2 x 10 blue TB 3 sec hold   ? Other Standing Lumbar Exercises chop/lift green band x 20 bilat   ?  ? Lumbar  Exercises: Seated  ? Other Seated Lumbar Exercises 10 reps holding 5 # KB   ?  ? Moist Heat Therapy  ? Number Minutes Moist Heat 10 Minutes   ? Moist Heat Location Lumbar Spine   ?  ? Manual Therapy  ? Soft tissue mobilization STM bilat lumbar paraspinals   ? ?  ?  ? ?  ? ? ? ? ? ? ? ? ? ? ? ? PT Short Term Goals - 10/28/21 1508   ? ?  ? PT SHORT TERM GOAL #1  ? Title Ind with initial HEP   ? Time 2   ? Period Weeks   ? Status New   ? Target Date 11/11/21   ? ?  ?  ? ?  ? ? ? ? PT Long Term Goals - 10/28/21 1508   ? ?  ? PT LONG TERM GOAL #1  ? Title Ind with advanced HEP   ? Time 8   ? Period Weeks   ? Status New   ? Target Date 12/23/21   ?  ? PT LONG TERM  GOAL #2  ? Title Patient able to climb stairs with a reciprocal gait and >= 90% reduction in pain.   ? Time 8   ? Period Weeks   ? Status New   ?  ? PT LONG TERM GOAL #3  ? Title Patient able to sit for work and driving for > one hour with no increase in LBP   ? Time 8   ? Period Weeks   ? Status New   ?  ? PT LONG TERM GOAL #4  ? Title Patient able to perform ADLs with functional lumbar ROM.   ? Time 8   ? Period Weeks   ? Status New   ?  ? PT LONG TERM GOAL #5  ? Title Improved hip strength to 5/5 to be able to peform correct body mechanics with squatting and lifting.   ? Time 8   ? Period Weeks   ? Status New   ?  ? Additional Long Term Goals  ? Additional Long Term Goals Yes   ?  ? PT LONG TERM GOAL #6  ? Title improved FOTO to 55 from 16 showing improved functional ability.   ? Time 8   ? Period Weeks   ? Status New   ? ?  ?  ? ?  ? ? ? ? ? ? ? ? Plan - 12/04/21 1232   ? ? Clinical Impression Statement Pt with improving exercise tolerance and core strength. Tight hip flexors noted with good relief with hip flexor stretch   ? PT Next Visit Plan hip and core strength and flexibility   ? PT Home Exercise Plan [email or web address]   ? Consulted and Agree with Plan of Care Patient   ? ?  ?  ? ?  ? ? ?Patient will benefit from skilled therapeutic intervention in order to  improve the following deficits and impairments:    ? ?Visit Diagnosis: ?Acute bilateral low back pain without sciatica ? ?Cramp and spasm ? ?Muscle weakness (generalized) ? ? ? ? ?Problem List ?There are no problems to display for this patient. ? ? ?Malayah Demuro, PT ?12/04/2021, 12:42 PM ? ?[place] [facility] ?[place], [place], [address] ?Phone: [phone]   Fax:  [phone] ? ?Name: Aanay Marrison ?MRN: [id number] ?Date of Birth: 1971/05/17 ? ? ? ?

## 2021-12-07 ENCOUNTER — Ambulatory Visit: Payer: Commercial Managed Care - HMO | Admitting: Rehabilitative and Restorative Service Providers"

## 2021-12-07 ENCOUNTER — Encounter: Payer: Self-pay | Admitting: Rehabilitative and Restorative Service Providers"

## 2021-12-07 DIAGNOSIS — M545 Low back pain, unspecified: Secondary | ICD-10-CM

## 2021-12-07 DIAGNOSIS — M6281 Muscle weakness (generalized): Secondary | ICD-10-CM

## 2021-12-07 DIAGNOSIS — R252 Cramp and spasm: Secondary | ICD-10-CM

## 2021-12-07 NOTE — Patient Instructions (Signed)
Access Code: [email or web address] ?URL: [email or web address] ?Date: 12/07/2021 ?Prepared by: Gillermo Murdoch ? ?Exercises ?- Supine Double Knee to Chest  - 3 x daily - 7 x weekly - 1 sets - 3 reps - 15 sec hold ?- Hooklying Single Knee to Chest Stretch  - 3 x daily - 7 x weekly - 1 sets - 3 reps - 20-30 sec hold ?- Supine Lower Trunk Rotation  - 2 x daily - 7 x weekly - 1 sets - 5 reps - 10 sec hold ?- Seated Piriformis Stretch with Trunk Bend  - 2 x daily - 7 x weekly - 1 sets - 3 reps - 30-60 sec  hold ?- Standing Anti-Rotation Press with Anchored Resistance  - 1 x daily - 7 x weekly - 2 sets - 10 reps ?- Shoulder extension with resistance - Neutral  - 1 x daily - 7 x weekly - 2 sets - 10 reps ?- Standing Diagonal Chops with Medicine Ball  - 1 x daily - 7 x weekly - 3 sets - 10 reps ?- Marching Bridge  - 1 x daily - 7 x weekly - 2 sets - 10 reps ?- Side Stepping with Resistance at Ankles  - 1 x daily - 7 x weekly - 2 sets - 10 reps ?- Goblet Squat with Kettlebell  - 1 x daily - 7 x weekly - 2 sets - 10 reps ?- Prone Press Up  - 2 x daily - 7 x weekly - 1 sets - 10 reps - 2-3 sec  hold ?

## 2021-12-07 NOTE — Therapy (Signed)
[place] ?[facility] ?[place] ?[place], [place], [address] ?Phone: [phone]   Fax:  [phone] ? ?Physical Therapy Treatment ? ?Patient Details  ?Name: Christopher Kaiser ?MRN: [id number] ?Date of Birth: 1970-09-12 ?Referring Provider (PT): Tammy Arn Medal ? ? ?Encounter Date: 12/07/2021 ? ? PT End of Session - 12/07/21 1436   ? ? Visit Number 13   ? Number of Visits 16   ? Date for PT Re-Evaluation 12/23/21   ? Authorization Type Med Pay /[employer] Managed   ? PT Start Time [id number]   ? PT Stop Time 1522   ? PT Time Calculation (min) 49 min   ? Activity Tolerance Patient tolerated treatment well   ? ?  ?  ? ?  ? ? ?Past Medical History:  ?Diagnosis Date  ? Diabetes mellitus without complication ([place])   ? Hypertension   ? Kidney stone   ? ? ?Past Surgical History:  ?Procedure Laterality Date  ? KIDNEY STONE SURGERY    ? ? ?There were no vitals filed for this visit. ? ? Subjective Assessment - 12/07/21 1436   ? ? Subjective No pain in the LB today and has not had any more spasms. He can walk up the steps without pain and sitting in the car is not as bad. He is getting up to take breaks when sitting at his desk. Back was sore after placing bricks around his flower bed Thursday but soreness resolved after a couple of days.   ? Currently in Pain? No/denies   ? Pain Score 0-No pain   ? Pain Location Back   ? Pain Orientation Lower;Medial   ? Pain Descriptors / Indicators Sore   ? Pain Type Acute pain   ? Pain Onset More than a month ago   ? ?  ?  ? ?  ? ? ? ? ? [facility] PT Assessment - 12/07/21 0001   ? ?  ? Assessment  ? Medical Diagnosis low back pain   ? Referring Provider (PT) Tammy Arn Medal   ? Onset Date/Surgical Date 10/23/21   ? Hand Dominance Left   ? Next MD Visit July 2023   ?  ? AROM  ? Overall AROM Comments WFL's   ? Lumbar Flexion 95% hands to top of feet   ?  ? Flexibility  ? Hamstrings tight end range Rt > Lt   ? Quadriceps WFL   ? Piriformis tight Rt > Lt    ? Quadratus Lumborum tight Rt > Lt   ? ?  ?  ? ?  ? ? ? ? ? ? ? ? ? ? ? ? ? ? ? ? [place] Adult PT Treatment/Exercise - 12/07/21 0001   ? ?  ? Self-Care  ? Lifting 20# KB lift x 10 reps with hip hinge   ?  ? Lumbar Exercises: Stretches  ? Press Ups 10 reps;5 seconds   ? Press Ups Limitations working on segmental mobility through lumbar spine with joint mobs btn press ups - pt moving through partial range increasing with mobs and reps for press ups   ?  ? Lumbar Exercises: Aerobic  ? Recumbent Bike L2 x 5 min   ?  ? Lumbar Exercises: Standing  ? Wall Slides 10 reps   ? Wall Slides Limitations 10 sec hold   ? Row Strengthening;Both   ? Row Limitations isometric hold stepback 60 sec hold x 3 reps   ? Shoulder Extension 20 reps   ?  Theraband Level (Shoulder Extension) Level 4 (Blue)   ? Shoulder Extension Limitations 3 sec hold   ? Other Standing Lumbar Exercises pallof press 2 x 10 blue TB 3 sec hold   ? Other Standing Lumbar Exercises chop/lift green band x 20 bilat   ?  ? Lumbar Exercises: Seated  ? Other Seated Lumbar Exercises 10 reps holding 10 # KB   ?  ? Moist Heat Therapy  ? Number Minutes Moist Heat 10 Minutes   ? Moist Heat Location Lumbar Spine   ?  ? Manual Therapy  ? Joint Mobilization PA mobs lumbar Grade III working on segmental mobility followed by prone press up; PA mobs Lt posterior hip   ? Passive ROM Lt hip IR/ER pt prone hip extended knee flexed tight in ER - improved with reps   ? ?  ?  ? ?  ? ? ? ? ? ? ? ? ? ? PT Education - 12/07/21 1516   ? ? Education Details HEP   ? Person(s) Educated Patient   ? Methods Explanation;Demonstration;Tactile cues;Verbal cues;Handout   ? Comprehension Verbalized understanding;Returned demonstration;Verbal cues required;Tactile cues required   ? ?  ?  ? ?  ? ? ? PT Short Term Goals - 10/28/21 1508   ? ?  ? PT SHORT TERM GOAL #1  ? Title Ind with initial HEP   ? Time 2   ? Period Weeks   ? Status New   ? Target Date 11/11/21   ? ?  ?  ? ?  ? ? ? ? PT Long Term Goals -  10/28/21 1508   ? ?  ? PT LONG TERM GOAL #1  ? Title Ind with advanced HEP   ? Time 8   ? Period Weeks   ? Status New   ? Target Date 12/23/21   ?  ? PT LONG TERM GOAL #2  ? Title Patient able to climb stairs with a reciprocal gait and >= 90% reduction in pain.   ? Time 8   ? Period Weeks   ? Status New   ?  ? PT LONG TERM GOAL #3  ? Title Patient able to sit for work and driving for > one hour with no increase in LBP   ? Time 8   ? Period Weeks   ? Status New   ?  ? PT LONG TERM GOAL #4  ? Title Patient able to perform ADLs with functional lumbar ROM.   ? Time 8   ? Period Weeks   ? Status New   ?  ? PT LONG TERM GOAL #5  ? Title Improved hip strength to 5/5 to be able to peform correct body mechanics with squatting and lifting.   ? Time 8   ? Period Weeks   ? Status New   ?  ? Additional Long Term Goals  ? Additional Long Term Goals Yes   ?  ? PT LONG TERM GOAL #6  ? Title improved FOTO to 55 from 16 showing improved functional ability.   ? Time 8   ? Period Weeks   ? Status New   ? ?  ?  ? ?  ? ? ? ? ? ? ? ? Plan - 12/07/21 1442   ? ? Clinical Impression Statement Progressing well with good decrease in pain in the LB and improvement in functional activities. Good improvement in lumbar mobility with mobs and prone press ups. Progressing well toward stated  goals of therapy.   ? Rehab Potential Good   ? PT Frequency 2x / week   ? PT Duration 8 weeks   ? PT Treatment/Interventions ADLs/Self Care Home Management;Aquatic Therapy;Cryotherapy;Electrical Stimulation;Moist Heat;Traction;Neuromuscular re-education;Therapeutic exercise;Therapeutic activities;Functional mobility training;Stair training;Patient/family education;Manual techniques;Dry needling;Taping   ? PT Next Visit Plan hip and core strength and flexibility   ? PT Home Exercise Plan [email or web address]   ? Consulted and Agree with Plan of Care Patient   ? ?  ?  ? ?  ? ? ?Patient will benefit from skilled therapeutic intervention in order to improve the following  deficits and impairments:    ? ?Visit Diagnosis: ?Acute bilateral low back pain without sciatica ? ?Cramp and spasm ? ?Muscle weakness (generalized) ? ? ? ? ?Problem List ?There are no problems to display for this patient. ? ? ?Everardo All, PT, MPH  ?12/07/2021, 3:17 PM ? ?[place] ?[facility] ?[place] ?[place], [place], [address] ?Phone: [phone]   Fax:  [phone] ? ?Name: Christopher Kaiser ?MRN: [id number] ?Date of Birth: Oct 22, 1970 ? ? ? ?

## 2021-12-09 ENCOUNTER — Ambulatory Visit: Payer: Commercial Managed Care - HMO | Admitting: Rehabilitative and Restorative Service Providers"

## 2021-12-09 ENCOUNTER — Encounter: Payer: Self-pay | Admitting: Rehabilitative and Restorative Service Providers"

## 2021-12-09 DIAGNOSIS — M545 Low back pain, unspecified: Secondary | ICD-10-CM

## 2021-12-09 DIAGNOSIS — M6281 Muscle weakness (generalized): Secondary | ICD-10-CM

## 2021-12-09 DIAGNOSIS — R252 Cramp and spasm: Secondary | ICD-10-CM

## 2021-12-09 NOTE — Therapy (Signed)
[place] ?[facility] ?[place] ?[place], [place], [address] ?Phone: [phone]   Fax:  [phone] ? ?Physical Therapy Treatment ? ?Patient Details  ?Name: Christopher Kaiser ?MRN: [id number] ?Date of Birth: Oct 02, 1970 ?Referring Provider (PT): Tammy Arn Medal ? ? ?Encounter Date: 12/09/2021 ? ? PT End of Session - 12/09/21 1149   ? ? Visit Number 14   ? Number of Visits 16   ? Date for PT Re-Evaluation 12/23/21   ? Authorization Type Med Pay /[employer] Managed   ? PT Start Time 1146   ? PT Stop Time 1234   ? PT Time Calculation (min) 48 min   ? Activity Tolerance Patient tolerated treatment well   ? ?  ?  ? ?  ? ? ?Past Medical History:  ?Diagnosis Date  ? Diabetes mellitus without complication ([place])   ? Hypertension   ? Kidney stone   ? ? ?Past Surgical History:  ?Procedure Laterality Date  ? KIDNEY STONE SURGERY    ? ? ?There were no vitals filed for this visit. ? ? Subjective Assessment - 12/09/21 1149   ? ? Subjective Doing well. No back pain. Working on some of his exercises at home.   ? Currently in Pain? No/denies   ? Pain Score 0-No pain   ? Pain Location Back   ? ?  ?  ? ?  ? ? ? ? ? ? ? ? ? ? ? ? ? ? ? ? ? ? ? ? [facility] Adult PT Treatment/Exercise - 12/09/21 0001   ? ?  ? Self-Care  ? Lifting 20# KB lift x 10 reps with hip hinge   ?  ? Lumbar Exercises: Stretches  ? Press Ups 10 reps;5 seconds   ? Press Ups Limitations working on segmental mobility through lumbar spine with joint mobs btn press ups - pt moving through partial range increasing with mobs and reps for press ups   ?  ? Lumbar Exercises: Aerobic  ? Recumbent Bike L3 x 5 min   ?  ? Lumbar Exercises: Standing  ? Wall Slides 10 reps   ? Wall Slides Limitations 10 sec hold; 10 # KB   ? Row Strengthening;Both   ? Theraband Level (Row) Level 4 (Blue)   ? Row Limitations isometric hold stepping bback 60 sec x 3 reps   ? Shoulder Extension 20 reps   ? Theraband Level (Shoulder Extension) Level 4 (Blue)   ?  Shoulder Extension Limitations 3 sec hold   ? Other Standing Lumbar Exercises pallof press 2 x 10 blue TB 3 sec hold   ? Other Standing Lumbar Exercises chop/lift green band x 20 bilat   ?  ? Lumbar Exercises: Seated  ? Sit to Stand 10 reps   ? Sit to Stand Limitations holding 10# KB   ?  ? Moist Heat Therapy  ? Number Minutes Moist Heat 10 Minutes   ? Moist Heat Location Lumbar Spine   ?  ? Manual Therapy  ? Joint Mobilization PA mobs lumbar Grade III working on segmental mobility followed by prone press up; PA mobs Lt posterior hip   ? Passive ROM Lt hip IR/ER pt prone hip extended knee flexed tight in ER - improved with reps   ? ?  ?  ? ?  ? ? ? ? ? ? ? ? ? ? ? ? PT Short Term Goals - 10/28/21 1508   ? ?  ? PT SHORT TERM GOAL #1  ?  Title Ind with initial HEP   ? Time 2   ? Period Weeks   ? Status New   ? Target Date 11/11/21   ? ?  ?  ? ?  ? ? ? ? PT Long Term Goals - 10/28/21 1508   ? ?  ? PT LONG TERM GOAL #1  ? Title Ind with advanced HEP   ? Time 8   ? Period Weeks   ? Status New   ? Target Date 12/23/21   ?  ? PT LONG TERM GOAL #2  ? Title Patient able to climb stairs with a reciprocal gait and >= 90% reduction in pain.   ? Time 8   ? Period Weeks   ? Status New   ?  ? PT LONG TERM GOAL #3  ? Title Patient able to sit for work and driving for > one hour with no increase in LBP   ? Time 8   ? Period Weeks   ? Status New   ?  ? PT LONG TERM GOAL #4  ? Title Patient able to perform ADLs with functional lumbar ROM.   ? Time 8   ? Period Weeks   ? Status New   ?  ? PT LONG TERM GOAL #5  ? Title Improved hip strength to 5/5 to be able to peform correct body mechanics with squatting and lifting.   ? Time 8   ? Period Weeks   ? Status New   ?  ? Additional Long Term Goals  ? Additional Long Term Goals Yes   ?  ? PT LONG TERM GOAL #6  ? Title improved FOTO to 55 from 16 showing improved functional ability.   ? Time 8   ? Period Weeks   ? Status New   ? ?  ?  ? ?  ? ? ? ? ? ? ? ? Plan - 12/09/21 1150   ? ? Clinical  Impression Statement Patient is progressing well with no pain. Patient has progressed and feels he is ready for discharge soon. Patient demonstrates improvement in lumbar extension with lumbar PA mobs and good improvement with lumbar extension with press ups.   ? Rehab Potential Good   ? PT Frequency 2x / week   ? PT Duration 8 weeks   ? PT Treatment/Interventions ADLs/Self Care Home Management;Aquatic Therapy;Cryotherapy;Electrical Stimulation;Moist Heat;Traction;Neuromuscular re-education;Therapeutic exercise;Therapeutic activities;Functional mobility training;Stair training;Patient/family education;Manual techniques;Dry needling;Taping   ? PT Next Visit Plan hip and core strength and flexibility   ? PT Home Exercise Plan [email or web address]   ? Consulted and Agree with Plan of Care Patient   ? ?  ?  ? ?  ? ? ?Patient will benefit from skilled therapeutic intervention in order to improve the following deficits and impairments:    ? ?Visit Diagnosis: ?Acute bilateral low back pain without sciatica ? ?Cramp and spasm ? ?Muscle weakness (generalized) ? ? ? ? ?Problem List ?There are no problems to display for this patient. ? ? ?Everardo All, PT, MPH ?12/09/2021, 12:27 PM ? ?[place] ?[facility] ?[place] ?[place], [place], [address] ?Phone: [phone]   Fax:  [phone] ? ?Name: Christopher Kaiser ?MRN: [id number] ?Date of Birth: [birth year] ? ? ? ?

## 2021-12-14 ENCOUNTER — Ambulatory Visit: Payer: Commercial Managed Care - HMO | Admitting: Rehabilitative and Restorative Service Providers"

## 2021-12-16 ENCOUNTER — Ambulatory Visit: Payer: Commercial Managed Care - HMO | Admitting: Physical Therapy

## 2021-12-16 DIAGNOSIS — M545 Low back pain, unspecified: Secondary | ICD-10-CM | POA: Diagnosis not present

## 2021-12-16 DIAGNOSIS — R252 Cramp and spasm: Secondary | ICD-10-CM

## 2021-12-16 DIAGNOSIS — M6281 Muscle weakness (generalized): Secondary | ICD-10-CM

## 2021-12-16 NOTE — Therapy (Signed)
[place] ?[facility] ?[place] ?[place], [place], [address] ?Phone: [phone]   Fax:  [phone] ? ?Physical Therapy Treatment and Discharge ? ?Patient Details  ?Name: Christopher Kaiser ?MRN: [id number] ?Date of Birth: 1970-12-30 ?Referring Provider (PT): Tammy Arn Medal ? ? ?Encounter Date: 12/16/2021 ? ? PT End of Session - 12/16/21 1221   ? ? Visit Number 15   ? Number of Visits 16   ? Date for PT Re-Evaluation 12/23/21   ? Authorization Type Med Pay /[employer] Managed   ? PT Start Time 1148   ? PT Stop Time 1230   ? PT Time Calculation (min) 42 min   ? Activity Tolerance Patient tolerated treatment well   ? Behavior During Therapy [facility] for tasks assessed/performed   ? ?  ?  ? ?  ? ? ?Past Medical History:  ?Diagnosis Date  ? Diabetes mellitus without complication ([place])   ? Hypertension   ? Kidney stone   ? ? ?Past Surgical History:  ?Procedure Laterality Date  ? KIDNEY STONE SURGERY    ? ? ?There were no vitals filed for this visit. ? ? Subjective Assessment - 12/16/21 1151   ? ? Subjective my back is feeling "good"   ? Patient Stated Goals strengthen my core and back, return to PLOF   ? Currently in Pain? No/denies   ? ?  ?  ? ?  ? ? ? ? ? [facility] PT Assessment - 12/16/21 0001   ? ?  ? Assessment  ? Medical Diagnosis low back pain   ? Referring Provider (PT) Tammy Arn Medal   ? Onset Date/Surgical Date 10/23/21   ? Hand Dominance Left   ? Next MD Visit July 2023   ?  ? AROM  ? Overall AROM Comments WFL   ?  ? Strength  ? Right Hip Flexion 5/5   ? Right Hip Extension 5/5   ? Right Hip ABduction 5/5   ? Left Hip Flexion 5/5   ? Left Hip Extension 5/5   ? Left Hip ABduction 5/5   ?  ? Flexibility  ? Hamstrings tight end range Rt > Lt   ? Quadriceps WFL   ? Piriformis tight Rt > Lt   ? Quadratus Lumborum tight Rt > Lt   ? ?  ?  ? ?  ? ? ? ? ? ? ? ? ? ? ? ? ? ? ? ? [place] Adult PT Treatment/Exercise - 12/16/21 0001   ? ?  ? Lumbar Exercises: Stretches  ? Single Knee to  Chest Stretch 2 reps;30 seconds   ? Press Ups 10 reps;5 seconds   ? Press Ups Limitations working on segmental mobility through lumbar spine with joint mobs btn press ups - pt moving through partial range increasing with mobs and reps for press ups   ? Figure 4 Stretch 2 reps;30 seconds   ?  ? Moist Heat Therapy  ? Number Minutes Moist Heat 10 Minutes   ? Moist Heat Location Lumbar Spine   ?  ? Manual Therapy  ? Manual therapy comments skilled palpation to assess effects of dry needling   ? Soft tissue mobilization STM lumbar paraspinals   ? Passive ROM Lt hip IR/ER pt prone hip extended knee flexed tight in ER - improved with reps   ? ?  ?  ? ?  ? ? ? [place] Dry Needling - 12/16/21 0001   ? ? Consent Given? Yes   ?  Education Handout Provided Previously provided   ? Erector spinae Response Palpable increased muscle length   ? Lumbar multifidi Response Palpable increased muscle length   ? ?  ?  ? ?  ? ? ? ? ? ? ? ? PT Education - 12/16/21 1221   ? ? Education Details plan for d/c   ? Person(s) Educated Patient   ? Methods Explanation   ? Comprehension Verbalized understanding   ? ?  ?  ? ?  ? ? ? PT Short Term Goals - 10/28/21 1508   ? ?  ? PT SHORT TERM GOAL #1  ? Title Ind with initial HEP   ? Time 2   ? Period Weeks   ? Status New   ? Target Date 11/11/21   ? ?  ?  ? ?  ? ? ? ? PT Long Term Goals - 12/16/21 1153   ? ?  ? PT LONG TERM GOAL #1  ? Title Ind with advanced HEP   ? Status Achieved   ?  ? PT LONG TERM GOAL #2  ? Title Patient able to climb stairs with a reciprocal gait and >= 90% reduction in pain.   ? Status Achieved   ?  ? PT LONG TERM GOAL #3  ? Title Patient able to sit for work and driving for > one hour with no increase in LBP   ? Status Achieved   ?  ? PT LONG TERM GOAL #4  ? Title Patient able to perform ADLs with functional lumbar ROM.   ? Status Achieved   ?  ? PT LONG TERM GOAL #5  ? Title Improved hip strength to 5/5 to be able to peform correct body mechanics with squatting and  lifting.   ? Status Achieved   ?  ? PT LONG TERM GOAL #6  ? Title improved FOTO to 55 from 16 showing improved functional ability.   ? Baseline 94   ? Status Achieved   ? ?  ?  ? ?  ? ? ? ? ? ? ? ? Plan - 12/16/21 1222   ? ? Clinical Impression Statement Pt has improved LE and core strength and mobility. He has met all goals and is ready for d/c to HEP   ? PT Next Visit Plan d/c   ? PT Home Exercise Plan [email or web address]   ? Consulted and Agree with Plan of Care Patient   ? ?  ?  ? ?  ? ? ?Patient will benefit from skilled therapeutic intervention in order to improve the following deficits and impairments:    ? ?Visit Diagnosis: ?Acute bilateral low back pain without sciatica ? ?Cramp and spasm ? ?Muscle weakness (generalized) ? ? ? ? ?Problem List ?There are no problems to display for this patient. ? ?PHYSICAL THERAPY DISCHARGE SUMMARY ? ?Visits from Start of Care: 15 ? ?Current functional level related to goals / functional outcomes: ?Improved ROM and strength, decreased pain ?  ?Remaining deficits: ?See above ?  ?Education / Equipment: ?HEP  ? ?Patient agrees to discharge. Patient goals were met. Patient is being discharged due to meeting the stated rehab goals. ? ?Aleathia Purdy, PT ?12/16/2021, 12:24 PM ? ?[place] ?[facility] ?[address] ?[place], [place], [address] ?Phone: [phone]   Fax:  [phone] ? ?Name: Christopher Kaiser ?MRN: [id number] ?Date of Birth: 01-28-1971 ? ? ? ?

## 2021-12-23 ENCOUNTER — Encounter: Payer: Managed Care, Other (non HMO) | Admitting: Physical Therapy

## 2021-12-25 ENCOUNTER — Encounter: Payer: Managed Care, Other (non HMO) | Admitting: Physical Therapy

## 2022-07-16 ENCOUNTER — Encounter (HOSPITAL_COMMUNITY): Payer: Self-pay

## 2022-07-16 ENCOUNTER — Emergency Department (HOSPITAL_COMMUNITY)
Admission: EM | Admit: 2022-07-16 | Discharge: 2022-07-16 | Disposition: A | Discharge status: Home / Self Care | Payer: Self-pay | Attending: Emergency Medicine | Admitting: Emergency Medicine

## 2022-07-16 ENCOUNTER — Emergency Department (HOSPITAL_COMMUNITY): Payer: Self-pay

## 2022-07-16 ENCOUNTER — Other Ambulatory Visit: Payer: Self-pay

## 2022-07-16 DIAGNOSIS — Z79899 Other long term (current) drug therapy: Secondary | ICD-10-CM | POA: Insufficient documentation

## 2022-07-16 DIAGNOSIS — I10 Essential (primary) hypertension: Secondary | ICD-10-CM | POA: Insufficient documentation

## 2022-07-16 DIAGNOSIS — N200 Calculus of kidney: Secondary | ICD-10-CM

## 2022-07-16 DIAGNOSIS — N132 Hydronephrosis with renal and ureteral calculous obstruction: Secondary | ICD-10-CM | POA: Insufficient documentation

## 2022-07-16 DIAGNOSIS — E119 Type 2 diabetes mellitus without complications: Secondary | ICD-10-CM | POA: Insufficient documentation

## 2022-07-16 DIAGNOSIS — R109 Unspecified abdominal pain: Secondary | ICD-10-CM

## 2022-07-16 LAB — CBC WITH DIFFERENTIAL/PLATELET
Abs Immature Granulocytes: 0.03 10*3/uL (ref 0.00–0.07)
Basophils Absolute: 0.1 10*3/uL (ref 0.0–0.1)
Basophils Relative: 0 %
Eosinophils Absolute: 0.2 10*3/uL (ref 0.0–0.5)
Eosinophils Relative: 1 %
HCT: 49.5 % (ref 39.0–52.0)
Hemoglobin: 15.8 g/dL (ref 13.0–17.0)
Immature Granulocytes: 0 %
Lymphocytes Relative: 11 %
Lymphs Abs: 1.4 10*3/uL (ref 0.7–4.0)
MCH: 27.8 pg (ref 26.0–34.0)
MCHC: 31.9 g/dL (ref 30.0–36.0)
MCV: 87.1 fL (ref 80.0–100.0)
Monocytes Absolute: 1.1 10*3/uL — ABNORMAL HIGH (ref 0.1–1.0)
Monocytes Relative: 8 %
Neutro Abs: 10.4 10*3/uL — ABNORMAL HIGH (ref 1.7–7.7)
Neutrophils Relative %: 80 %
Platelets: 202 10*3/uL (ref 150–400)
RBC: 5.68 MIL/uL (ref 4.22–5.81)
RDW: 13.8 % (ref 11.5–15.5)
WBC: 13.1 10*3/uL — ABNORMAL HIGH (ref 4.0–10.5)
nRBC: 0 % (ref 0.0–0.2)

## 2022-07-16 LAB — URINALYSIS, ROUTINE W REFLEX MICROSCOPIC
Bilirubin Urine: NEGATIVE
Glucose, UA: NEGATIVE mg/dL
Hgb urine dipstick: NEGATIVE
Ketones, ur: NEGATIVE mg/dL
Leukocytes,Ua: NEGATIVE
Nitrite: NEGATIVE
Protein, ur: NEGATIVE mg/dL
Specific Gravity, Urine: 1.013 (ref 1.005–1.030)
pH: 6 (ref 5.0–8.0)

## 2022-07-16 LAB — BASIC METABOLIC PANEL
Anion gap: 11 (ref 5–15)
BUN: 17 mg/dL (ref 6–20)
CO2: 24 mmol/L (ref 22–32)
Calcium: 9.2 mg/dL (ref 8.9–10.3)
Chloride: 103 mmol/L (ref 98–111)
Creatinine, Ser: 1.64 mg/dL — ABNORMAL HIGH (ref 0.61–1.24)
GFR, Estimated: 51 mL/min — ABNORMAL LOW (ref >60)
Glucose, Bld: 93 mg/dL (ref 70–99)
Potassium: 4 mmol/L (ref 3.5–5.1)
Sodium: 138 mmol/L (ref 135–145)

## 2022-07-16 MED ORDER — OXYCODONE-ACETAMINOPHEN 5-325 MG PO TABS: 1.0000 | ORAL_TABLET | Freq: Four times a day (QID) | ORAL | 0 refills | Status: AC | PRN

## 2022-07-16 MED ORDER — HYDROMORPHONE HCL 1 MG/ML IJ SOLN
0.5000 mg | Freq: Once | INTRAMUSCULAR | Status: AC
  Administered 2022-07-16: 0.5 mg via INTRAVENOUS
  Filled 2022-07-16: qty 1

## 2022-07-16 MED ORDER — ONDANSETRON 8 MG PO TBDP: 8.0000 mg | ORAL_TABLET | Freq: Three times a day (TID) | ORAL | 0 refills | Status: AC | PRN

## 2022-07-16 MED ORDER — KETOROLAC TROMETHAMINE 30 MG/ML IJ SOLN
30.0000 mg | Freq: Once | INTRAMUSCULAR | Status: AC
Start: 2022-07-16 — End: 2022-07-16
  Administered 2022-07-16: 30 mg via INTRAMUSCULAR
  Filled 2022-07-16: qty 1

## 2022-07-16 MED ORDER — ONDANSETRON HCL 4 MG/2ML IJ SOLN
4.0000 mg | Freq: Once | INTRAMUSCULAR | Status: AC
  Administered 2022-07-16: 4 mg via INTRAVENOUS
  Filled 2022-07-16: qty 2

## 2022-07-16 NOTE — ED Provider Notes (Addendum)
[facility] EMERGENCY DEPARTMENT Provider Note   CSN: [id number] Arrival date & time: 07/16/22  2563     History  Chief Complaint  Patient presents with   Flank Pain    Christopher Kaiser is a 51 y.o. male.  Pt c/o right flank pain. Symptoms acute onset in past couple days, moderate-severe, dull, non radiating. Similar to prior kidney stone pain. No dysuria or hematuria. No fever or chills. Nausea, no vomiting.   The history is provided by the patient, medical records and a relative.  Flank Pain Pertinent negatives include no chest pain and no shortness of breath.       Home Medications Prior to Admission medications   Medication Sig Start Date End Date Taking? Authorizing Provider  amlodipine-atorvastatin (CADUET) 10-10 MG tablet Take 1 tablet by mouth daily.    [provider]  lidocaine (LIDODERM) 5 % Place 1 patch onto the skin daily. Remove & Discard patch within 12 hours or as directed by MD 10/23/21   Blue, Soijett A, PA-C  methocarbamol (ROBAXIN) 500 MG tablet Take 1 tablet (500 mg total) by mouth 2 (two) times daily. 10/23/21   Blue, Soijett A, PA-C  naproxen (NAPROSYN) 375 MG tablet Take 1 tablet (375 mg total) by mouth 2 (two) times daily with a meal. 10/27/21   Wallis Bamberg, PA-C  Semaglutide,0.25 or 0.5MG /DOS, (OZEMPIC, 0.25 OR 0.5 MG/DOSE,) 2 MG/1.5ML SOPN Inject 0.4 mLs (0.5 mg total) into the skin once a week. Fridays 09/10/21     tiZANidine (ZANAFLEX) 4 MG tablet Take 1 tablet (4 mg total) by mouth at bedtime. 10/27/21   Wallis Bamberg, PA-C      Allergies    Patient has no known allergies.    Review of Systems   Review of Systems  Constitutional:  Negative for chills and fever.  Respiratory:  Negative for cough and shortness of breath.   Cardiovascular:  Negative for chest pain.  Gastrointestinal:  Positive for nausea.  Genitourinary:  Positive for flank pain. Negative for dysuria and hematuria.    Physical Exam Updated Vital Signs BP  (!) 174/87 (BP Location: Right Arm)   Pulse 76   Temp 98.7 F (37.1 C)   Resp 18   Ht 1.854 m (6\' 1" )   Wt 120.2 kg   SpO2 100%   BMI 34.96 kg/m  Physical Exam Vitals and nursing note reviewed.  Constitutional:      Appearance: Normal appearance. He is well-developed.  HENT:     Head: Atraumatic.     Nose: Nose normal.     Mouth/Throat:     Mouth: Mucous membranes are moist.  Eyes:     General: No scleral icterus.    Conjunctiva/sclera: Conjunctivae normal.  Neck:     Trachea: No tracheal deviation.  Cardiovascular:     Rate and Rhythm: Normal rate.     Pulses: Normal pulses.     Heart sounds:     No friction rub.  Pulmonary:     Effort: Pulmonary effort is normal. No accessory muscle usage.  Abdominal:     General: Bowel sounds are normal. There is no distension.     Palpations: Abdomen is soft. There is no mass.     Tenderness: There is no abdominal tenderness. There is no guarding.  Genitourinary:    Comments: No cva tenderness. Musculoskeletal:        General: No swelling.     Cervical back: Neck supple.  Skin:    General:  Skin is warm and dry.     Findings: No rash.  Neurological:     Mental Status: He is alert.     Comments: Alert, speech clear.   Psychiatric:        Mood and Affect: Mood normal.     ED Results / Procedures / Treatments   Labs (all labs ordered are listed, but only abnormal results are displayed) Results for orders placed or performed during the hospital encounter of 07/16/22  Urinalysis, Routine w reflex microscopic Urine, Clean Catch  Result Value Ref Range   Color, Urine STRAW (A) YELLOW   APPearance CLEAR CLEAR   Specific Gravity, Urine 1.013 1.005 - 1.030   pH 6.0 5.0 - 8.0   Glucose, UA NEGATIVE NEGATIVE mg/dL   Hgb urine dipstick NEGATIVE NEGATIVE   Bilirubin Urine NEGATIVE NEGATIVE   Ketones, ur NEGATIVE NEGATIVE mg/dL   Protein, ur NEGATIVE NEGATIVE mg/dL   Nitrite NEGATIVE NEGATIVE   Leukocytes,Ua NEGATIVE NEGATIVE   CBC with Differential  Result Value Ref Range   WBC 13.1 (H) 4.0 - 10.5 K/uL   RBC 5.68 4.22 - 5.81 MIL/uL   Hemoglobin 15.8 13.0 - 17.0 g/dL   HCT 16.149.5 09.639.0 - 04.552.0 %   MCV 87.1 80.0 - 100.0 fL   MCH 27.8 26.0 - 34.0 pg   MCHC 31.9 30.0 - 36.0 g/dL   RDW 40.913.8 81.111.5 - 91.415.5 %   Platelets 202 150 - 400 K/uL   nRBC 0.0 0.0 - 0.2 %   Neutrophils Relative % 80 %   Neutro Abs 10.4 (H) 1.7 - 7.7 K/uL   Lymphocytes Relative 11 %   Lymphs Abs 1.4 0.7 - 4.0 K/uL   Monocytes Relative 8 %   Monocytes Absolute 1.1 (H) 0.1 - 1.0 K/uL   Eosinophils Relative 1 %   Eosinophils Absolute 0.2 0.0 - 0.5 K/uL   Basophils Relative 0 %   Basophils Absolute 0.1 0.0 - 0.1 K/uL   Immature Granulocytes 0 %   Abs Immature Granulocytes 0.03 0.00 - 0.07 K/uL   CT Renal Stone Study  Result Date: 07/16/2022 CLINICAL DATA:  Right flank pain EXAM: CT ABDOMEN AND PELVIS WITHOUT CONTRAST TECHNIQUE: Multidetector CT imaging of the abdomen and pelvis was performed following the standard protocol without IV contrast. RADIATION DOSE REDUCTION: This exam was performed according to the departmental dose-optimization program which includes automated exposure control, adjustment of the mA and/or kV according to patient size and/or use of iterative reconstruction technique. COMPARISON:  08/28/2021 FINDINGS: Lower chest: Included lung bases are clear.  Heart size is normal. Hepatobiliary: Decreased attenuation of the hepatic parenchyma. No liver lesion identified on unenhanced images. Unremarkable gallbladder. No hyperdense gallstone. No biliary dilatation. Pancreas: Unremarkable. No pancreatic ductal dilatation or surrounding inflammatory changes. Spleen: Normal in size without focal abnormality. Adrenals/Urinary Tract: Unremarkable adrenal glands. 6 mm stone within the urinary bladder adjacent to the right ureterovesical junction. Moderate right-sided hydroureteronephrosis and mild right perinephric stranding. Left kidney is  unremarkable. Previously seen left nephroureteral stent has been removed. No left-sided calculi. Stomach/Bowel: Stomach is within normal limits. No evidence of bowel wall thickening, distention, or inflammatory changes. Vascular/Lymphatic: No significant vascular findings are present. No enlarged abdominal or pelvic lymph nodes. Reproductive: Prostate is unremarkable. Other: No free fluid. No abdominopelvic fluid collection. No pneumoperitoneum. No abdominal wall hernia. Musculoskeletal: Stable rounded sclerotic lesion within the T11 vertebral body, likely bone island. No new or acute bony findings. IMPRESSION: 1. Moderate right-sided hydroureteronephrosis. 6 mm stone  within the urinary bladder adjacent to the right ureterovesical junction. 2. Hepatic steatosis. Electronically Signed   By: Duanne Guess D.O.   On: 07/16/2022 10:23     EKG None  Radiology CT Renal Stone Study  Result Date: 07/16/2022 CLINICAL DATA:  Right flank pain EXAM: CT ABDOMEN AND PELVIS WITHOUT CONTRAST TECHNIQUE: Multidetector CT imaging of the abdomen and pelvis was performed following the standard protocol without IV contrast. RADIATION DOSE REDUCTION: This exam was performed according to the departmental dose-optimization program which includes automated exposure control, adjustment of the mA and/or kV according to patient size and/or use of iterative reconstruction technique. COMPARISON:  08/28/2021 FINDINGS: Lower chest: Included lung bases are clear.  Heart size is normal. Hepatobiliary: Decreased attenuation of the hepatic parenchyma. No liver lesion identified on unenhanced images. Unremarkable gallbladder. No hyperdense gallstone. No biliary dilatation. Pancreas: Unremarkable. No pancreatic ductal dilatation or surrounding inflammatory changes. Spleen: Normal in size without focal abnormality. Adrenals/Urinary Tract: Unremarkable adrenal glands. 6 mm stone within the urinary bladder adjacent to the right ureterovesical  junction. Moderate right-sided hydroureteronephrosis and mild right perinephric stranding. Left kidney is unremarkable. Previously seen left nephroureteral stent has been removed. No left-sided calculi. Stomach/Bowel: Stomach is within normal limits. No evidence of bowel wall thickening, distention, or inflammatory changes. Vascular/Lymphatic: No significant vascular findings are present. No enlarged abdominal or pelvic lymph nodes. Reproductive: Prostate is unremarkable. Other: No free fluid. No abdominopelvic fluid collection. No pneumoperitoneum. No abdominal wall hernia. Musculoskeletal: Stable rounded sclerotic lesion within the T11 vertebral body, likely bone island. No new or acute bony findings. IMPRESSION: 1. Moderate right-sided hydroureteronephrosis. 6 mm stone within the urinary bladder adjacent to the right ureterovesical junction. 2. Hepatic steatosis. Electronically Signed   By: Duanne Guess D.O.   On: 07/16/2022 10:23    Procedures Procedures    Medications Ordered in ED Medications  HYDROmorphone (DILAUDID) injection 0.5 mg (has no administration in time range)  ondansetron (ZOFRAN) injection 4 mg (has no administration in time range)  ketorolac (TORADOL) 30 MG/ML injection 30 mg (30 mg Intramuscular Given 07/16/22 0945)    ED Course/ Medical Decision Making/ A&P                           Medical Decision Making Problems Addressed: Essential hypertension: chronic illness or injury with exacerbation, progression, or side effects of treatment that poses a threat to life or bodily functions Hydronephrosis with urinary obstruction due to renal calculus: acute illness or injury with systemic symptoms that poses a threat to life or bodily functions 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 Labs: ordered. Decision-making details documented in ED Course. Radiology: ordered and independent interpretation performed. Decision-making details documented in ED Course.  Risk Prescription drug management. Parenteral controlled substances. Decision regarding hospitalization.   Iv ns. Continuous pulse ox and cardiac monitoring. Labs ordered/sent. Imaging ordered.   Diff dx includes ureteral stone, kidney stone, appendicitis, etc - dispo decision including potential need for admission of ureteral obstruction w intractable pain, abd infx, etc - will get imaging and reassess.   Reviewed nursing notes and prior charts for  additional history. External reports reviewed. Additional history from: family.  Cardiac monitor: sinus rhythm, rate 76.  Labs reviewed/interpreted by me - ua neg for uti.  Hgb normal.   CT reviewed/interpreted by me - 6 mm stone at uvj.  Pain improve w toradol.   Dilaudid iv, zofran iv.   Pt notes resolution of pain. No nv. Abd soft nt.   Pt appears stable for d/c.   Return precautions provided.   Pt requests pain and nausea rx be sent to pharmacy (even though pain currently resolved w ct findings noted/discussed) - small quantity rx provided.            Final Clinical Impression(s) / ED Diagnoses Final diagnoses:  None    Rx / DC Orders ED Discharge Orders     None         Cathren Laine, MD 07/16/22 1308      Cathren Laine, MD 07/16/22 1325

## 2022-07-16 NOTE — ED Triage Notes (Signed)
Pt arrived POV from home c/o right flank pain that started yesterday. Pt states he has a hx of kidney stones but on the left side not the right. Pt also states he was doing yard work yesterday and it is a possibility that he pulled a muscle.

## 2022-07-16 NOTE — ED Provider Triage Note (Signed)
Emergency Medicine Provider Triage Evaluation Note  Christopher Kaiser , a 51 y.o. male  was evaluated in triage.  Pt complains of sudden onset of right flank pain.  Started on Tuesday.  Hx of kidney stones, also notes the day before he had been doing some repetitive lifting movements above the head on that side.  However has noticed increasing frequency in the pain.  Yesterday noted the pain became constant and now associated with nausea.  Denies dysuria or blood in the urine.  Denies change in bowel movements.  Denies fevers, endorses possible chills.  No hx of chronic kidney disease per pt.  Review of Systems  Positive:  Negative: See above  Physical Exam  BP (!) 174/87 (BP Location: Right Arm)   Pulse 76   Temp 98.7 F (37.1 C)   Resp 18   Ht 6\' 1"  (1.854 m)   Wt 120.2 kg   SpO2 100%   BMI 34.96 kg/m  Gen:   Awake, no distress   Resp:  Normal effort  MSK:   Moves extremities without difficulty  Other:  Abdomen soft, mild right flank tenderness  Medical Decision Making  Medically screening exam initiated at 9:29 AM.  Appropriate orders placed.  Christopher Kaiser was informed that the remainder of the evaluation will be completed by another provider, this initial triage assessment does not replace that evaluation, and the importance of remaining in the ED until their evaluation is complete.     Chalmers Cater, PA-C 07/16/22 [phone]

## 2022-07-16 NOTE — Discharge Instructions (Addendum)
It was our pleasure to provide your ER care today - we hope that you feel better.  Drink plenty of fluids/stay well hydrated.  Take acetaminophen or ibuprofen as need.   Your ct appears there was a 6 mm kidney stone on the right side - it appears that stone is now in your bladder.  Follow up with urologist in one week if symptoms fail to improve/resolve.  Your blood pressure is high today - continue meds and follow up with primary care doctor in the next couple weeks.   Return to ER if worse, new symptoms, fevers, new, worsening or severe pain, or other concern.  You were given pain meds in the ER - no driving for the next 6 hours.

## 2023-02-07 ENCOUNTER — Other Ambulatory Visit: Payer: Self-pay

## 2023-02-07 DIAGNOSIS — L089 Local infection of the skin and subcutaneous tissue, unspecified: Secondary | ICD-10-CM

## 2023-02-08 ENCOUNTER — Ambulatory Visit
Admission: RE | Admit: 2023-02-08 | Discharge: 2023-02-08 | Disposition: A | Discharge status: Home / Self Care | Payer: Self-pay | Source: Ambulatory Visit | Attending: Family Medicine | Admitting: Family Medicine

## 2023-02-08 DIAGNOSIS — L089 Local infection of the skin and subcutaneous tissue, unspecified: Secondary | ICD-10-CM

## 2023-03-02 ENCOUNTER — Other Ambulatory Visit: Payer: Self-pay | Admitting: Physician Assistant

## 2023-03-02 DIAGNOSIS — R221 Localized swelling, mass and lump, neck: Secondary | ICD-10-CM

## 2023-03-10 ENCOUNTER — Encounter: Payer: Self-pay | Admitting: Physician Assistant

## 2023-03-11 ENCOUNTER — Ambulatory Visit
Admission: RE | Admit: 2023-03-11 | Discharge: 2023-03-11 | Disposition: A | Discharge status: Home / Self Care | Payer: PRIVATE HEALTH INSURANCE | Source: Ambulatory Visit | Attending: Physician Assistant | Admitting: Physician Assistant

## 2023-03-11 DIAGNOSIS — R221 Localized swelling, mass and lump, neck: Secondary | ICD-10-CM

## 2023-03-11 MED ORDER — IOPAMIDOL (ISOVUE-300) INJECTION 61%
75.0000 mL | Freq: Once | INTRAVENOUS | Status: AC | PRN
  Administered 2023-03-11: 75 mL via INTRAVENOUS
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
